# Patient Record
Sex: Male | Born: 1950
Health system: Southern US, Community
[De-identification: ages and names within clinical notes are randomized; demographics above are authoritative.]

## PROBLEM LIST (undated history)

## (undated) DIAGNOSIS — I10 Essential (primary) hypertension: Secondary | ICD-10-CM

## (undated) DIAGNOSIS — C801 Malignant (primary) neoplasm, unspecified: Secondary | ICD-10-CM

## (undated) DIAGNOSIS — Z720 Tobacco use: Secondary | ICD-10-CM

## (undated) DIAGNOSIS — K2970 Gastritis, unspecified, without bleeding: Secondary | ICD-10-CM

## (undated) DIAGNOSIS — C61 Malignant neoplasm of prostate: Secondary | ICD-10-CM

## (undated) DIAGNOSIS — R3915 Urgency of urination: Secondary | ICD-10-CM

## (undated) DIAGNOSIS — B9681 Helicobacter pylori [H. pylori] as the cause of diseases classified elsewhere: Secondary | ICD-10-CM

## (undated) DIAGNOSIS — R972 Elevated prostate specific antigen [PSA]: Secondary | ICD-10-CM

## (undated) HISTORY — DX: Essential (primary) hypertension: I10

## (undated) HISTORY — DX: Urgency of urination: R39.15

## (undated) HISTORY — PX: TONSILLECTOMY: SUR1361

## (undated) HISTORY — DX: Helicobacter pylori (H. pylori) as the cause of diseases classified elsewhere: B96.81

## (undated) HISTORY — DX: Malignant (primary) neoplasm, unspecified: C80.1

## (undated) HISTORY — DX: Gastritis, unspecified, without bleeding: K29.70

## (undated) HISTORY — DX: Tobacco use: Z72.0

## (undated) HISTORY — DX: Malignant neoplasm of prostate: C61

## (undated) HISTORY — DX: Elevated prostate specific antigen (PSA): R97.20

---

## 2006-08-17 ENCOUNTER — Other Ambulatory Visit: Payer: Self-pay

## 2006-08-17 ENCOUNTER — Inpatient Hospital Stay: Payer: Self-pay | Admitting: Internal Medicine

## 2006-08-23 ENCOUNTER — Ambulatory Visit: Payer: Self-pay | Admitting: Unknown Physician Specialty

## 2006-11-01 ENCOUNTER — Ambulatory Visit: Payer: Self-pay | Admitting: Unknown Physician Specialty

## 2007-05-03 ENCOUNTER — Emergency Department: Payer: Self-pay

## 2011-02-04 ENCOUNTER — Emergency Department: Payer: Self-pay | Admitting: *Deleted

## 2011-03-05 ENCOUNTER — Ambulatory Visit: Payer: Self-pay | Admitting: Urology

## 2011-03-09 ENCOUNTER — Ambulatory Visit: Payer: Self-pay | Admitting: Urology

## 2011-09-20 ENCOUNTER — Ambulatory Visit: Payer: Self-pay | Admitting: Urology

## 2011-09-22 HISTORY — PX: GREEN LIGHT LASER TURP (TRANSURETHRAL RESECTION OF PROSTATE: SHX6260

## 2011-09-24 ENCOUNTER — Ambulatory Visit: Payer: Self-pay | Admitting: Urology

## 2014-08-15 NOTE — H&P (Signed)
PATIENT NAME:  Mario Hernandez, Mario Hernandez MR#:  [id number] DATE OF BIRTH:  05/01/1950  DATE OF ADMISSION:  09/24/2011  CHIEF COMPLAINT: Difficulty voiding.   HISTORY OF PRESENT ILLNESS: Mr. Stickler is a 64 year old Caucasian male with a long history of lower urinary tract symptoms. He was also found to have an elevated PSA of 23.62 and underwent subsequent ultrasound-guided biopsy of the prostate revealing a Gleason's grade 4 + 3 adenocarcinoma involving both sides of his prostate. Staging studies included abdominal pelvic CT scan with and without contrast, and bone scan, and did not reveal any metastatic disease. Because of his significant lower urinary tract symptoms, he has been on Avodart, DES, aspirin, and Casodex in an attempt to downsize his prostate. He does not wish to have radical prostatectomy or external beam radiation therapy, but rather has opted for brachytherapy. He comes in now for photovaporization of the prostate with a green light laser because of persistent urinary symptoms and intravesical growth of median lobe with obstruction.   ALLERGIES: Penicillin.   CURRENT MEDICATIONS: Benicar and Avodart.   PAST SURGICAL HISTORY:  1. Tonsillectomy as a child. 2. Repair of injury to one of his distal left fingers.   SOCIAL HISTORY: He smokes a pack a day and has greater than 40 pack-year history. He consumes 1 to 4 alcoholic beverages per week.   FAMILY HISTORY: Remarkable for father with heart disease and hypertension and kidney stones.   PAST AND CURRENT MEDICAL CONDITIONS:  1. Hypertension.  2. Seasonal allergies.   REVIEW OF SYSTEMS: The patient denies chest pain, shortness of breath, diabetes, or stroke.   PHYSICAL EXAMINATION:   GENERAL: Well-nourished white male in no acute distress.   HEENT: Sclerae were clear. Pupils were equally round and reactive to light and accommodation. Extraocular movements were intact.   NECK: Supple. No palpable cervical adenopathy. No audible carotid  bruits.   LUNGS: Clear to auscultation.   CARDIOVASCULAR: Regular rhythm and rate without audible murmurs.   ABDOMEN: Soft and nontender abdomen.   GENITOURINARY: Circumcised with atrophic testes.   NEUROMUSCULAR: Nonfocal.   RECTAL: 25 gram smooth, nontender prostate.   IMPRESSION:  1. Bladder outlet obstruction secondary to benign prostatic hypertrophy. 2. Prostate cancer.   PLAN: Photovaporization of the prostate with green light laser. ____________________________ Otelia Limes. Yves Dill, MD [email or web address] D: 09/20/2011 10:18:38 ET     T: 09/20/2011 10:53:15 ET        JOB#: [id number] cc: Otelia Limes. Yves Dill, MD, <Dictator> Royston Cowper MD ELECTRONICALLY SIGNED 09/24/2011 7:40

## 2014-08-15 NOTE — Op Note (Signed)
PATIENT NAME:  Mario Hernandez, Mario Hernandez MR#:  [id number] DATE OF BIRTH:  12-08-1950  DATE OF PROCEDURE:  09/24/2011  PREOPERATIVE DIAGNOSES:  1. Bladder outlet obstruction.  2. Prostate cancer.   POSTOPERATIVE DIAGNOSES:  1. Bladder outlet obstruction.  2. Prostate cancer.   PROCEDURE: Photovaporization of the prostate with green light laser.   SURGEON: Otelia Limes. Yves Dill, MD   ANESTHETIST: Dr. [place] Service   ANESTHETIC METHOD: General.   INDICATIONS: See the dictated history and physical. After informed consent, the patient requests the above procedure.   OPERATIVE SUMMARY: After adequate general anesthesia had been obtained, the patient was placed into dorsal lithotomy position and the perineum was prepped and draped in the usual fashion. The laser scope was coupled with the camera and then visually advanced into the bladder. The patient had trilobar BPH with obstructing lateral lobes and median lobe. Bladder was heavily trabeculated. No bladder tumors were identified. Both ureteral orifices were identified and had clear efflux. At this point the green light laser XPS fiber was introduced through the scope and vaporization of the prostate was begun at the bladder neck at setting of 80 watts. Power was then increased to 120 watts and remaining obstructive tissue was vaporized to the level of the verumontanum. At this point the scope was removed and a 20 [place] silicone catheter was placed. Catheter was irrigated until clear. A B and O suppository was placed.      The procedure was then terminated and the patient was transferred to the recovery room in stable condition.   ____________________________ Otelia Limes. Yves Dill, MD [email or web address] D: 09/24/2011 13:26:36 ET T: 09/24/2011 13:31:56 ET JOB#: [id number]  cc: Otelia Limes. Yves Dill, MD, <Dictator> Royston Cowper MD ELECTRONICALLY SIGNED 09/27/2011 12:30

## 2015-05-06 DIAGNOSIS — Z0000 Encounter for general adult medical examination without abnormal findings: Secondary | ICD-10-CM | POA: Diagnosis not present

## 2015-05-06 DIAGNOSIS — Z125 Encounter for screening for malignant neoplasm of prostate: Secondary | ICD-10-CM | POA: Diagnosis not present

## 2015-05-10 ENCOUNTER — Encounter: Payer: Self-pay | Admitting: Urology

## 2015-05-10 ENCOUNTER — Ambulatory Visit (INDEPENDENT_AMBULATORY_CARE_PROVIDER_SITE_OTHER): Payer: PPO | Admitting: Urology

## 2015-05-10 VITALS — BP 133/87 | HR 75 | Ht 68.0 in | Wt 146.9 lb

## 2015-05-10 DIAGNOSIS — R972 Elevated prostate specific antigen [PSA]: Secondary | ICD-10-CM | POA: Diagnosis not present

## 2015-05-10 DIAGNOSIS — Z87891 Personal history of nicotine dependence: Secondary | ICD-10-CM | POA: Insufficient documentation

## 2015-05-10 DIAGNOSIS — C61 Malignant neoplasm of prostate: Secondary | ICD-10-CM

## 2015-05-10 DIAGNOSIS — I10 Essential (primary) hypertension: Secondary | ICD-10-CM | POA: Insufficient documentation

## 2015-05-10 DIAGNOSIS — C801 Malignant (primary) neoplasm, unspecified: Secondary | ICD-10-CM | POA: Insufficient documentation

## 2015-05-10 DIAGNOSIS — Z720 Tobacco use: Secondary | ICD-10-CM

## 2015-05-10 HISTORY — DX: Tobacco use: Z72.0

## 2015-05-10 HISTORY — DX: Malignant (primary) neoplasm, unspecified: C80.1

## 2015-05-10 HISTORY — DX: Essential (primary) hypertension: I10

## 2015-05-10 LAB — URINALYSIS, COMPLETE
BILIRUBIN UA: NEGATIVE
Glucose, UA: NEGATIVE
KETONES UA: NEGATIVE
Nitrite, UA: NEGATIVE
PH UA: 6 (ref 5.0–7.5)
PROTEIN UA: NEGATIVE
RBC UA: NEGATIVE
SPEC GRAV UA: 1.025 (ref 1.005–1.030)
UUROB: 0.2 mg/dL (ref 0.2–1.0)

## 2015-05-10 LAB — MICROSCOPIC EXAMINATION: RBC MICROSCOPIC, UA: NONE SEEN /HPF (ref 0–?)

## 2015-05-10 NOTE — Progress Notes (Signed)
05/10/2015 10:42 AM   Mario Hernandez 01-12-1951 [id number]  Referring provider: No referring provider defined for this encounter.  Chief Complaint  Patient presents with  . Prostate Cancer    rising PSA    HPI: Mr Mario Hernandez is a 65yo seen in consultation today for elevated PSA. His PSA on referral is 12.3. He has a hx of greenlight in 2013 by Dr. Rogers Hernandez. He has a known hx of prostate cancer. He had a PSA of 23.6 and on biopsy was found to have Gleason 4+3=7 prostate cancer. After PVP his PSA was 0.1 and was on firmagon. He has not followed up since 2015. He was due to for ADT in 06/2014.  His last bone and CT scan was several years. He has new right shoulder pain for the past 3 months.    He has mild urgency and occasional urge incontinence.  No hematuria and dysuria.     PMH: Past Medical History  Diagnosis Date  . Prostate cancer (Mario Hernandez)   . Helicobacter pylori gastritis   . Hypertension   . Elevated PSA   . Urinary urgency     Surgical History: Past Surgical History  Procedure Laterality Date  . Tonsillectomy    . Green light laser turp (transurethral resection of prostate  09/2011    Home Medications:    Medication List       This list is accurate as of: 05/10/15 10:42 AM.  Always use your most recent med list.               CIALIS 5 MG tablet  Generic drug:  tadalafil  TAKE 1 TABLET BY MOUTH EVERY 3 DAYS AS DIRECTED.     losartan 25 MG tablet  Commonly known as:  COZAAR  take 1 tablet by mouth once daily        Allergies:  Allergies  Allergen Reactions  . Penicillins     Other reaction(s): Unknown    Family History: Family History  Problem Relation Age of Onset  . Coronary artery disease Father   . Kidney Stones Brother   . Hypertension Brother   . Bladder Cancer Neg Hx   . Prostate cancer Neg Hx   . Kidney cancer Neg Hx     Social History:  reports that he has been smoking Cigarettes.  He has been smoking about 1.00 pack per day. He does  not have any smokeless tobacco history on file. He reports that he drinks alcohol. He reports that he uses illicit drugs.  ROS: UROLOGY Frequent Urination?: No Hard to postpone urination?: Yes Burning/pain with urination?: No Get up at night to urinate?: No Leakage of urine?: Yes Urine stream starts and stops?: No Trouble starting stream?: No Do you have to strain to urinate?: No Blood in urine?: No Urinary tract infection?: No Sexually transmitted disease?: No Injury to kidneys or bladder?: No Painful intercourse?: No Weak stream?: No Erection problems?: Yes Penile pain?: No  Gastrointestinal Nausea?: No Vomiting?: No Indigestion/heartburn?: No Diarrhea?: No Constipation?: No  Constitutional Fever: No Night sweats?: No Weight loss?: No Fatigue?: No  Skin Skin rash/lesions?: No Itching?: No  Eyes Blurred vision?: No Double vision?: No  Ears/Nose/Throat Sore throat?: No Sinus problems?: No  Hematologic/Lymphatic Swollen glands?: No Easy bruising?: No  Cardiovascular Leg swelling?: No Chest pain?: No  Respiratory Cough?: No Shortness of breath?: No  Endocrine Excessive thirst?: No  Musculoskeletal Back pain?: No Joint pain?: Yes  Neurological Headaches?: No Dizziness?: No  Psychologic  Depression?: No Anxiety?: No  Physical Exam: BP 133/87 mmHg  Pulse 75  Ht 5\' 8"  (1.727 m)  Wt 66.633 kg (146 lb 14.4 oz)  BMI 22.34 kg/m2  Constitutional:  Alert and oriented, No acute distress. HEENT: [place] AT, moist mucus membranes.  Trachea midline, no masses. Cardiovascular: No clubbing, cyanosis, or edema. Respiratory: Normal respiratory effort, no increased work of breathing. GI: Abdomen is soft, nontender, nondistended, no abdominal masses GU: No CVA tenderness. Prostate 30g, flat, no nodules, no induration Skin: No rashes, bruises or suspicious lesions. Lymph: No cervical or inguinal adenopathy. Neurologic: Grossly intact, no focal deficits,  moving all 4 extremities. Psychiatric: Normal mood and affect.  Laboratory Data: Lab Results  Component Value Date   HGB 13.7 09/20/2011    No results found for: CREATININE  No results found for: PSA  No results found for: TESTOSTERONE  No results found for: HGBA1C  Urinalysis No results found for: COLORURINE, APPEARANCEUR, LABSPEC, PHURINE, GLUCOSEU, HGBUR, BILIRUBINUR, KETONESUR, PROTEINUR, UROBILINOGEN, NITRITE, LEUKOCYTESUR  Pertinent Imaging: none  Assessment & Plan:    1. Elevated PSA -CT and bone scan  2. Prostate cancer (Mario Hernandez) -CT and bone scan   No Follow-up on file.  Mario Hernandez, [facility] [address] [place], [place] [address] [phone]

## 2015-05-13 DIAGNOSIS — C801 Malignant (primary) neoplasm, unspecified: Secondary | ICD-10-CM | POA: Diagnosis not present

## 2015-05-13 DIAGNOSIS — M25511 Pain in right shoulder: Secondary | ICD-10-CM | POA: Diagnosis not present

## 2015-05-13 DIAGNOSIS — F17200 Nicotine dependence, unspecified, uncomplicated: Secondary | ICD-10-CM | POA: Diagnosis not present

## 2015-05-13 DIAGNOSIS — I10 Essential (primary) hypertension: Secondary | ICD-10-CM | POA: Diagnosis not present

## 2015-05-23 ENCOUNTER — Encounter
Admission: RE | Admit: 2015-05-23 | Discharge: 2015-05-23 | Disposition: A | Discharge status: Home / Self Care | Payer: PPO | Source: Ambulatory Visit | Attending: Urology | Admitting: Urology

## 2015-05-23 ENCOUNTER — Ambulatory Visit
Admission: RE | Admit: 2015-05-23 | Discharge: 2015-05-23 | Disposition: A | Discharge status: Home / Self Care | Payer: PPO | Source: Ambulatory Visit | Attending: Urology | Admitting: Urology

## 2015-05-23 DIAGNOSIS — C61 Malignant neoplasm of prostate: Secondary | ICD-10-CM

## 2015-05-23 DIAGNOSIS — I2510 Atherosclerotic heart disease of native coronary artery without angina pectoris: Secondary | ICD-10-CM | POA: Insufficient documentation

## 2015-05-23 DIAGNOSIS — N329 Bladder disorder, unspecified: Secondary | ICD-10-CM | POA: Insufficient documentation

## 2015-05-23 DIAGNOSIS — R911 Solitary pulmonary nodule: Secondary | ICD-10-CM | POA: Insufficient documentation

## 2015-05-23 DIAGNOSIS — R972 Elevated prostate specific antigen [PSA]: Secondary | ICD-10-CM | POA: Diagnosis not present

## 2015-05-23 MED ORDER — TECHNETIUM TC 99M MEDRONATE IV KIT
22.6390 | PACK | Freq: Once | INTRAVENOUS | Status: AC | PRN
  Administered 2015-05-23: 22.639 via INTRAVENOUS

## 2015-05-23 MED ORDER — IOHEXOL 350 MG/ML SOLN
100.0000 mL | Freq: Once | INTRAVENOUS | Status: AC | PRN
  Administered 2015-05-23: 100 mL via INTRAVENOUS

## 2015-05-24 ENCOUNTER — Ambulatory Visit (INDEPENDENT_AMBULATORY_CARE_PROVIDER_SITE_OTHER): Payer: PPO | Admitting: Urology

## 2015-05-24 ENCOUNTER — Encounter: Payer: Self-pay | Admitting: Urology

## 2015-05-24 VITALS — BP 123/83 | HR 66 | Ht 68.5 in | Wt 145.2 lb

## 2015-05-24 DIAGNOSIS — C61 Malignant neoplasm of prostate: Secondary | ICD-10-CM | POA: Diagnosis not present

## 2015-05-24 NOTE — Progress Notes (Signed)
2:35 PM   Mario Hernandez 04-Apr-1951 [id number]  Referring provider: Tracie Harrier, MD [address] [facility] [place], [place] [address]  Chief Complaint  Patient presents with  . Follow-up    Prostate Cancer, CT & Bone Scan    HPI:  1 - High Risk Prostate Cancer - very unusual history with initial diagnosis 2013 and underwent greenlight PVP (NOT a cancer surgery) + short term androgen deprivation for Gleason 4+3=7 disease and PSA 23.6.  Recent Course: 04/2015 - PSA 12.3, CT and Bone Scan w/o metastatic disease  2 - Lower Urinary Tract Symptoms - s/p PVP 2013. No obstructive symptoms. Occasional bother from urge.   Today " Mario Hernandez " is seen in f/u above. Interval CT and bone scan favorable without evidence of distant disease, therefore still has clinically localized high-risk disease.   PMH: Past Medical History  Diagnosis Date  . Prostate cancer ([place])   . Helicobacter pylori gastritis   . Hypertension   . Elevated PSA   . Urinary urgency     Surgical History: Past Surgical History  Procedure Laterality Date  . Tonsillectomy    . Green light laser turp (transurethral resection of prostate  09/2011    Home Medications:    Medication List       This list is accurate as of: 05/24/15  2:35 PM.  Always use your most recent med list.               CIALIS 5 MG tablet  Generic drug:  tadalafil  TAKE 1 TABLET BY MOUTH EVERY 3 DAYS AS DIRECTED.     losartan 25 MG tablet  Commonly known as:  COZAAR  take 1 tablet by mouth once daily        Allergies:  Allergies  Allergen Reactions  . Penicillins     Other reaction(s): Unknown    Family History: Family History  Problem Relation Age of Onset  . Coronary artery disease Father   . Kidney Stones Brother   . Hypertension Brother   . Bladder Cancer Neg Hx   . Prostate cancer Neg Hx   . Kidney cancer Neg Hx     Social History:  reports that he has been smoking Cigarettes.  He has been  smoking about 1.00 pack per day. He does not have any smokeless tobacco history on file. He reports that he drinks alcohol. He reports that he uses illicit drugs.    Review of Systems  Gastrointestinal (upper)  : Negative for upper GI symptoms  Gastrointestinal (lower) : Negative for lower GI symptoms  Constitutional : Negative for symptoms  Skin: Negative for skin symptoms  Eyes: Negative for eye symptoms  Ear/Nose/Throat : Negative for Ear/Nose/Throat symptoms  Hematologic/Lymphatic: Negative for Hematologic/Lymphatic symptoms  Cardiovascular : Negative for cardiovascular symptoms  Respiratory : Negative for respiratory symptoms  Endocrine: Negative for endocrine symptoms  Musculoskeletal: Negative for musculoskeletal symptoms  Neurological: Negative for neurological symptoms  Psychologic: Negative for psychiatric symptoms     Physical Exam: There were no vitals taken for this visit.  Constitutional:  Alert and oriented, No acute distress. HEENT: [place] AT, moist mucus membranes.  Trachea midline, no masses. Cardiovascular: No clubbing, cyanosis, or edema. Respiratory: Normal respiratory effort, no increased work of breathing. GI: Abdomen is soft, nontender, nondistended, no abdominal masses GU: No CVA tenderness. Prostate 30g, flat, no nodules, no induration Skin: No rashes, bruises or suspicious lesions. Lymph: No cervical or inguinal adenopathy. Neurologic: Grossly  intact, no focal deficits, moving all 4 extremities. Psychiatric: Normal mood and affect.  Laboratory Data: Lab Results  Component Value Date   HGB 13.7 09/20/2011    No results found for: CREATININE  No results found for: PSA  No results found for: TESTOSTERONE  No results found for: HGBA1C  Urinalysis    Component Value Date/Time   GLUCOSEU Negative 05/10/2015 1030   BILIRUBINUR Negative 05/10/2015 1030   NITRITE Negative 05/10/2015 1030   LEUKOCYTESUR 1+* 05/10/2015 1030      Pertinent Imaging: none  Assessment & Plan:    1 - High Risk Prostate Cancer - disease remains clinically localized. Discussed options of curative-intent treatment (XRT, likely with 2 years ADT) v. Surgery (not favored given h/o piror outlet procedure as much worse incontinence likely), v. Non-curative intent management. We discussed the natural history of his disease with likely local and then possibly eventual distant progression over years-decades w/o therapy.   He opts for radiation oncology referral and I agree.   2 - Lower Urinary Tract Symptoms - presently minimal bother, discussed likely transient worsening of irritative symptoms with XRT.    No Follow-up on file.  Alexis Frock, [facility] [address], [place], [place] [address] [phone]

## 2015-05-30 ENCOUNTER — Institutional Professional Consult (permissible substitution): Payer: PPO | Admitting: Radiation Oncology

## 2015-06-01 ENCOUNTER — Encounter: Payer: Self-pay | Admitting: Radiation Oncology

## 2015-06-01 ENCOUNTER — Ambulatory Visit
Admission: RE | Admit: 2015-06-01 | Discharge: 2015-06-01 | Disposition: A | Discharge status: Home / Self Care | Payer: PPO | Source: Ambulatory Visit | Attending: Radiation Oncology | Admitting: Radiation Oncology

## 2015-06-01 VITALS — BP 130/80 | HR 70 | Temp 97.6°F | Wt 145.9 lb

## 2015-06-01 DIAGNOSIS — C61 Malignant neoplasm of prostate: Secondary | ICD-10-CM | POA: Diagnosis not present

## 2015-06-01 DIAGNOSIS — Z510 Encounter for antineoplastic radiation therapy: Secondary | ICD-10-CM | POA: Insufficient documentation

## 2015-06-01 NOTE — Consult Note (Signed)
Except an outstanding is perfect of Radiation Oncology NEW PATIENT EVALUATION  Name: Mario Hernandez  MRN: [id number]  Date:   06/01/2015     DOB: [birth year]   This 65 y.o. male patient presents to the clinic for initial evaluation of stage II adenocarcinoma the prostate Gleason score of 7 (4+3) presenting now with a PSA of 12.3 status post greenlight therapy in 2013 now with progressive PSA.  REFERRING PHYSICIAN: Tracie Harrier, MD  CHIEF COMPLAINT:  Chief Complaint  Patient presents with  . Prostate Cancer    Initial evaluation for radiation treatment    DIAGNOSIS: The encounter diagnosis was Malignant neoplasm of prostate ([place]).   PREVIOUS INVESTIGATIONS:  Bone scan and PET/CT scan reviewed Pathology report reviewed Clinical notes reviewed  HPI: Patient is a 65 year old male diagnosed with adenocarcinoma of the prostate back in 2013 status post greenlight therapy and androgen to deprivation therapy at that time by local urologist. His initial PSA was 23.6 with biopsy positive adenocarcinoma Gleason 7 (4+3). Patient was put on firma gone and after PVP his PSA dropped 0.1. He had been on continuous androgen deprivation therapy although discontinued and March 2016 although his PSA climbed to 12.3. Repeat bone scans  CT scan both were negative for metastatic disease. CT scans showed no evidence of pelvic lymphadenopathy. Based on the fact tumor remains localize clinically curative options would be radiation therapy versus surgery not favored giving prior history of out let obstruction from his prior greenlight therapy. He states his urgency and frequency are fairly stable. He does have erectile dysfunction is recently on cialis he is seen today for radiation oncology opinion.Marland Kitchen  PLANNED TREATMENT REGIMEN: I MRT radiation therapy  PAST MEDICAL HISTORY:  has a past medical history of Prostate cancer ([place]); Helicobacter pylori gastritis; Hypertension; Elevated PSA; Urinary urgency; BP (high  blood pressure) (05/10/2015); Current tobacco use (05/10/2015); and Malignant neoplastic disease ([place]) (05/10/2015).    PAST SURGICAL HISTORY:  Past Surgical History  Procedure Laterality Date  . Tonsillectomy    . Green light laser turp (transurethral resection of prostate  09/2011    FAMILY HISTORY: family history includes Coronary artery disease in his father; Hypertension in his brother; Kidney Stones in his brother. There is no history of Bladder Cancer, Prostate cancer, or Kidney cancer.  SOCIAL HISTORY:  reports that he has been smoking Cigarettes.  He has been smoking about 1.00 pack per day. He does not have any smokeless tobacco history on file. He reports that he drinks alcohol. He reports that he uses illicit drugs.  ALLERGIES: Penicillins  MEDICATIONS:  Current Outpatient Prescriptions  Medication Sig Dispense Refill  . losartan (COZAAR) 25 MG tablet take 1 tablet by mouth once daily    . meloxicam (MOBIC) 7.5 MG tablet Take by mouth.    . tadalafil (CIALIS) 5 MG tablet TAKE 1 TABLET BY MOUTH EVERY 3 DAYS AS DIRECTED.     No current facility-administered medications for this encounter.    ECOG PERFORMANCE STATUS:  0 - Asymptomatic  REVIEW OF SYSTEMS: Except for urgency frequency of urination nocturia 2-3 Patient denies any weight loss, fatigue, weakness, fever, chills or night sweats. Patient denies any loss of vision, blurred vision. Patient denies any ringing  of the ears or hearing loss. No irregular heartbeat. Patient denies heart murmur or history of fainting. Patient denies any chest pain or pain radiating to her upper extremities. Patient denies any shortness of breath, difficulty breathing at night, cough or hemoptysis. Patient denies any swelling  in the lower legs. Patient denies any nausea vomiting, vomiting of blood, or coffee ground material in the vomitus. Patient denies any stomach pain. Patient states has had normal bowel movements no significant constipation or  diarrhea. Patient denies any dysuria, hematuria or significant nocturia. Patient denies any problems walking, swelling in the joints or loss of balance. Patient denies any skin changes, loss of hair or loss of weight. Patient denies any excessive worrying or anxiety or significant depression. Patient denies any problems with insomnia. Patient denies excessive thirst, polyuria, polydipsia. Patient denies any swollen glands, patient denies easy bruising or easy bleeding. Patient denies any recent infections, allergies or URI. Patient "s visual fields have not changed significantly in recent time.    PHYSICAL EXAM: BP 130/80 mmHg  Pulse 70  Temp(Src) 97.6 F (36.4 C)  Wt 145 lb 15.1 oz (66.2 kg) On rectal exam rectal sphincter tone is good prostate is shrunk no evidence of mass or nodularity is noted sulcus is indistinct bilaterally. No other rectal abnormality is identified. Well-developed well-nourished patient in NAD. HEENT reveals PERLA, EOMI, discs not visualized.  Oral cavity is clear. No oral mucosal lesions are identified. Neck is clear without evidence of cervical or supraclavicular adenopathy. Lungs are clear to A&P. Cardiac examination is essentially unremarkable with regular rate and rhythm without murmur rub or thrill. Abdomen is benign with no organomegaly or masses noted. Motor sensory and DTR levels are equal and symmetric in the upper and lower extremities. Cranial nerves II through XII are grossly intact. Proprioception is intact. No peripheral adenopathy or edema is identified. No motor or sensory levels are noted. Crude visual fields are within normal range.  LABORATORY DATA: Pathology reports requested for my review    RADIOLOGY RESULTS: CT scan and bone scan reviewed compatible with the above-stated findings   IMPRESSION: High risk prostate cancer Gleason 7 (4+3) with rising PSA in 65 year old male status post greenlight therapy for years prior.  PLAN: At this time I to go  ahead with curative radiation therapy. I would prefer I am RT treatment planning and delivery so I can treat his pelvic lymph nodes as well as his prostate. By the [facility] nomogram he has close to 20% chance of lymph node involvement at this time and an 80% chance of extracapsular extension. I would treat his prostate 8000 cGy and his pelvic lymph nodes to 5400 cGy using I am RT dose painting technique. Risks and benefits of treatment including increased lower urinary tract symptoms, fatigue, worsening of erectile dysfunction diarrhea all were discussed in detail with the patient. I have asked urology to place gold fiduciary markers for daily image guided treatment. I have set up and ordered CT simulation on the patient shortly thereafter. Patient also benefited from Lupron therapy again for the next 2 years although the patient based on his previous symptoms and tolerance of firm gone is reluctant to undergo that therapy. We will discuss that further at our next meeting.  I would like to take this opportunity for allowing me to participate in the care of your patient.Armstead Peaks., MD

## 2015-06-16 ENCOUNTER — Ambulatory Visit (INDEPENDENT_AMBULATORY_CARE_PROVIDER_SITE_OTHER): Payer: PPO | Admitting: Urology

## 2015-06-16 ENCOUNTER — Encounter: Payer: Self-pay | Admitting: Urology

## 2015-06-16 VITALS — BP 149/85 | HR 73 | Ht 68.0 in | Wt 146.0 lb

## 2015-06-16 DIAGNOSIS — C61 Malignant neoplasm of prostate: Secondary | ICD-10-CM | POA: Diagnosis not present

## 2015-06-16 MED ORDER — GENTAMICIN SULFATE 40 MG/ML IJ SOLN
80.0000 mg | Freq: Once | INTRAMUSCULAR | Status: AC
  Administered 2015-06-16: 80 mg via INTRAMUSCULAR

## 2015-06-16 MED ORDER — LEVOFLOXACIN 500 MG PO TABS
500.0000 mg | ORAL_TABLET | Freq: Once | ORAL | Status: AC
Start: 2015-06-16 — End: 2015-06-16
  Administered 2015-06-16: 500 mg via ORAL

## 2015-06-16 NOTE — Progress Notes (Signed)
06/16/2015   CC: prostate cancer   HPI: 65 year old male with Gleason 4+3 prostate cancer, PSA 23 undergoing external beam radiation and possible ADT with Dr. Berton Mount. I was asked to place fiducial gold seed markers today.  Prostate Gold Seed Marker Placement Procedure   Informed consent was obtained after discussing risks/benefits of the procedure.  A time out was performed to ensure correct patient identity.  Pre-Procedure: - Gentamicin given prophylactically - PO levaquin given  Procedure: - Lidojet applied per rectum, transrectal ultrasound probe placed -Visualization of the prostate revealed significant final agreement laser TURP defect, relatively small prostate - 3 fiducial gold seed markers placed, one at right base, one at left base, one at apex of prostate gland under transrectal ultrasound guidance  Post-Procedure: - Patient tolerated the procedure well - He was counseled to seek immediate medical attention if experiences any severe pain, significant bleeding, or fevers  Plan: F/u in 6 months to discuss voiding symptoms, f/u prostate cancer

## 2015-06-21 ENCOUNTER — Ambulatory Visit
Admission: RE | Admit: 2015-06-21 | Discharge: 2015-06-21 | Disposition: A | Discharge status: Home / Self Care | Payer: PPO | Source: Ambulatory Visit | Attending: Radiation Oncology | Admitting: Radiation Oncology

## 2015-06-21 DIAGNOSIS — Z510 Encounter for antineoplastic radiation therapy: Secondary | ICD-10-CM | POA: Diagnosis not present

## 2015-06-21 DIAGNOSIS — C61 Malignant neoplasm of prostate: Secondary | ICD-10-CM | POA: Diagnosis not present

## 2015-06-23 ENCOUNTER — Other Ambulatory Visit: Payer: Self-pay | Admitting: *Deleted

## 2015-06-23 DIAGNOSIS — C61 Malignant neoplasm of prostate: Secondary | ICD-10-CM

## 2015-06-24 DIAGNOSIS — C61 Malignant neoplasm of prostate: Secondary | ICD-10-CM | POA: Diagnosis not present

## 2015-06-24 DIAGNOSIS — Z510 Encounter for antineoplastic radiation therapy: Secondary | ICD-10-CM | POA: Diagnosis not present

## 2015-06-30 ENCOUNTER — Ambulatory Visit
Admission: RE | Admit: 2015-06-30 | Discharge: 2015-06-30 | Disposition: A | Discharge status: Home / Self Care | Payer: PPO | Source: Ambulatory Visit | Attending: Radiation Oncology | Admitting: Radiation Oncology

## 2015-06-30 DIAGNOSIS — Z510 Encounter for antineoplastic radiation therapy: Secondary | ICD-10-CM | POA: Diagnosis not present

## 2015-06-30 DIAGNOSIS — C61 Malignant neoplasm of prostate: Secondary | ICD-10-CM | POA: Diagnosis not present

## 2015-07-04 ENCOUNTER — Ambulatory Visit
Admission: RE | Admit: 2015-07-04 | Discharge: 2015-07-04 | Disposition: A | Discharge status: Home / Self Care | Payer: PPO | Source: Ambulatory Visit | Attending: Radiation Oncology | Admitting: Radiation Oncology

## 2015-07-04 DIAGNOSIS — Z510 Encounter for antineoplastic radiation therapy: Secondary | ICD-10-CM | POA: Diagnosis not present

## 2015-07-04 DIAGNOSIS — C61 Malignant neoplasm of prostate: Secondary | ICD-10-CM | POA: Diagnosis not present

## 2015-07-05 ENCOUNTER — Ambulatory Visit
Admission: RE | Admit: 2015-07-05 | Discharge: 2015-07-05 | Disposition: A | Discharge status: Home / Self Care | Payer: PPO | Source: Ambulatory Visit | Attending: Radiation Oncology | Admitting: Radiation Oncology

## 2015-07-05 DIAGNOSIS — C61 Malignant neoplasm of prostate: Secondary | ICD-10-CM | POA: Diagnosis not present

## 2015-07-05 DIAGNOSIS — Z510 Encounter for antineoplastic radiation therapy: Secondary | ICD-10-CM | POA: Diagnosis not present

## 2015-07-06 ENCOUNTER — Ambulatory Visit
Admission: RE | Admit: 2015-07-06 | Discharge: 2015-07-06 | Disposition: A | Discharge status: Home / Self Care | Payer: PPO | Source: Ambulatory Visit | Attending: Radiation Oncology | Admitting: Radiation Oncology

## 2015-07-06 DIAGNOSIS — Z510 Encounter for antineoplastic radiation therapy: Secondary | ICD-10-CM | POA: Diagnosis not present

## 2015-07-06 DIAGNOSIS — C61 Malignant neoplasm of prostate: Secondary | ICD-10-CM | POA: Diagnosis not present

## 2015-07-07 ENCOUNTER — Ambulatory Visit
Admission: RE | Admit: 2015-07-07 | Discharge: 2015-07-07 | Disposition: A | Discharge status: Home / Self Care | Payer: PPO | Source: Ambulatory Visit | Attending: Radiation Oncology | Admitting: Radiation Oncology

## 2015-07-07 DIAGNOSIS — C61 Malignant neoplasm of prostate: Secondary | ICD-10-CM | POA: Diagnosis not present

## 2015-07-07 DIAGNOSIS — Z510 Encounter for antineoplastic radiation therapy: Secondary | ICD-10-CM | POA: Diagnosis not present

## 2015-07-08 ENCOUNTER — Ambulatory Visit
Admission: RE | Admit: 2015-07-08 | Discharge: 2015-07-08 | Disposition: A | Discharge status: Home / Self Care | Payer: PPO | Source: Ambulatory Visit | Attending: Radiation Oncology | Admitting: Radiation Oncology

## 2015-07-08 DIAGNOSIS — Z510 Encounter for antineoplastic radiation therapy: Secondary | ICD-10-CM | POA: Diagnosis not present

## 2015-07-08 DIAGNOSIS — C61 Malignant neoplasm of prostate: Secondary | ICD-10-CM | POA: Diagnosis not present

## 2015-07-11 ENCOUNTER — Ambulatory Visit
Admission: RE | Admit: 2015-07-11 | Discharge: 2015-07-11 | Disposition: A | Discharge status: Home / Self Care | Payer: PPO | Source: Ambulatory Visit | Attending: Radiation Oncology | Admitting: Radiation Oncology

## 2015-07-11 ENCOUNTER — Ambulatory Visit: Payer: PPO

## 2015-07-11 DIAGNOSIS — C61 Malignant neoplasm of prostate: Secondary | ICD-10-CM | POA: Diagnosis not present

## 2015-07-11 DIAGNOSIS — Z510 Encounter for antineoplastic radiation therapy: Secondary | ICD-10-CM | POA: Diagnosis not present

## 2015-07-12 ENCOUNTER — Ambulatory Visit
Admission: RE | Admit: 2015-07-12 | Discharge: 2015-07-12 | Disposition: A | Discharge status: Home / Self Care | Payer: PPO | Source: Ambulatory Visit | Attending: Radiation Oncology | Admitting: Radiation Oncology

## 2015-07-12 DIAGNOSIS — C61 Malignant neoplasm of prostate: Secondary | ICD-10-CM | POA: Diagnosis not present

## 2015-07-12 DIAGNOSIS — Z510 Encounter for antineoplastic radiation therapy: Secondary | ICD-10-CM | POA: Diagnosis not present

## 2015-07-13 ENCOUNTER — Ambulatory Visit
Admission: RE | Admit: 2015-07-13 | Discharge: 2015-07-13 | Disposition: A | Discharge status: Home / Self Care | Payer: PPO | Source: Ambulatory Visit | Attending: Radiation Oncology | Admitting: Radiation Oncology

## 2015-07-13 DIAGNOSIS — C61 Malignant neoplasm of prostate: Secondary | ICD-10-CM | POA: Diagnosis not present

## 2015-07-13 DIAGNOSIS — Z510 Encounter for antineoplastic radiation therapy: Secondary | ICD-10-CM | POA: Diagnosis not present

## 2015-07-14 ENCOUNTER — Ambulatory Visit
Admission: RE | Admit: 2015-07-14 | Discharge: 2015-07-14 | Disposition: A | Discharge status: Home / Self Care | Payer: PPO | Source: Ambulatory Visit | Attending: Radiation Oncology | Admitting: Radiation Oncology

## 2015-07-14 DIAGNOSIS — Z510 Encounter for antineoplastic radiation therapy: Secondary | ICD-10-CM | POA: Diagnosis not present

## 2015-07-14 DIAGNOSIS — C61 Malignant neoplasm of prostate: Secondary | ICD-10-CM | POA: Diagnosis not present

## 2015-07-15 ENCOUNTER — Ambulatory Visit
Admission: RE | Admit: 2015-07-15 | Discharge: 2015-07-15 | Disposition: A | Discharge status: Home / Self Care | Payer: PPO | Source: Ambulatory Visit | Attending: Radiation Oncology | Admitting: Radiation Oncology

## 2015-07-15 DIAGNOSIS — Z510 Encounter for antineoplastic radiation therapy: Secondary | ICD-10-CM | POA: Diagnosis not present

## 2015-07-15 DIAGNOSIS — C61 Malignant neoplasm of prostate: Secondary | ICD-10-CM | POA: Diagnosis not present

## 2015-07-18 ENCOUNTER — Ambulatory Visit: Payer: PPO

## 2015-07-18 ENCOUNTER — Inpatient Hospital Stay: Payer: PPO | Attending: Radiation Oncology

## 2015-07-18 ENCOUNTER — Ambulatory Visit
Admission: RE | Admit: 2015-07-18 | Discharge: 2015-07-18 | Disposition: A | Discharge status: Home / Self Care | Payer: PPO | Source: Ambulatory Visit | Attending: Radiation Oncology | Admitting: Radiation Oncology

## 2015-07-18 DIAGNOSIS — C61 Malignant neoplasm of prostate: Secondary | ICD-10-CM | POA: Insufficient documentation

## 2015-07-19 ENCOUNTER — Other Ambulatory Visit: Payer: Self-pay | Admitting: *Deleted

## 2015-07-19 ENCOUNTER — Ambulatory Visit
Admission: RE | Admit: 2015-07-19 | Discharge: 2015-07-19 | Disposition: A | Discharge status: Home / Self Care | Payer: PPO | Source: Ambulatory Visit | Attending: Radiation Oncology | Admitting: Radiation Oncology

## 2015-07-19 ENCOUNTER — Inpatient Hospital Stay: Payer: PPO

## 2015-07-19 DIAGNOSIS — Z510 Encounter for antineoplastic radiation therapy: Secondary | ICD-10-CM | POA: Diagnosis not present

## 2015-07-19 DIAGNOSIS — C61 Malignant neoplasm of prostate: Secondary | ICD-10-CM

## 2015-07-19 LAB — CBC
HCT: 42.8 % (ref 40.0–52.0)
Hemoglobin: 14.6 g/dL (ref 13.0–18.0)
MCH: 31.5 pg (ref 26.0–34.0)
MCHC: 34.2 g/dL (ref 32.0–36.0)
MCV: 92.2 fL (ref 80.0–100.0)
PLATELETS: 142 10*3/uL — AB (ref 150–440)
RBC: 4.64 MIL/uL (ref 4.40–5.90)
RDW: 13.9 % (ref 11.5–14.5)
WBC: 4.3 10*3/uL (ref 3.8–10.6)

## 2015-07-19 MED ORDER — PHENAZOPYRIDINE HCL 200 MG PO TABS
200.0000 mg | ORAL_TABLET | Freq: Three times a day (TID) | ORAL | Status: DC | PRN
Start: 2015-07-19 — End: 2018-01-16

## 2015-07-20 ENCOUNTER — Ambulatory Visit
Admission: RE | Admit: 2015-07-20 | Discharge: 2015-07-20 | Disposition: A | Discharge status: Home / Self Care | Payer: PPO | Source: Ambulatory Visit | Attending: Radiation Oncology | Admitting: Radiation Oncology

## 2015-07-20 DIAGNOSIS — Z510 Encounter for antineoplastic radiation therapy: Secondary | ICD-10-CM | POA: Diagnosis not present

## 2015-07-20 DIAGNOSIS — C61 Malignant neoplasm of prostate: Secondary | ICD-10-CM | POA: Diagnosis not present

## 2015-07-21 ENCOUNTER — Ambulatory Visit
Admission: RE | Admit: 2015-07-21 | Discharge: 2015-07-21 | Disposition: A | Discharge status: Home / Self Care | Payer: PPO | Source: Ambulatory Visit | Attending: Radiation Oncology | Admitting: Radiation Oncology

## 2015-07-21 DIAGNOSIS — Z510 Encounter for antineoplastic radiation therapy: Secondary | ICD-10-CM | POA: Diagnosis not present

## 2015-07-21 DIAGNOSIS — C61 Malignant neoplasm of prostate: Secondary | ICD-10-CM | POA: Diagnosis not present

## 2015-07-22 ENCOUNTER — Ambulatory Visit
Admission: RE | Admit: 2015-07-22 | Discharge: 2015-07-22 | Disposition: A | Discharge status: Home / Self Care | Payer: PPO | Source: Ambulatory Visit | Attending: Radiation Oncology | Admitting: Radiation Oncology

## 2015-07-22 DIAGNOSIS — Z510 Encounter for antineoplastic radiation therapy: Secondary | ICD-10-CM | POA: Diagnosis not present

## 2015-07-22 DIAGNOSIS — C61 Malignant neoplasm of prostate: Secondary | ICD-10-CM | POA: Diagnosis not present

## 2015-07-25 ENCOUNTER — Ambulatory Visit
Admission: RE | Admit: 2015-07-25 | Discharge: 2015-07-25 | Disposition: A | Discharge status: Home / Self Care | Payer: PPO | Source: Ambulatory Visit | Attending: Radiation Oncology | Admitting: Radiation Oncology

## 2015-07-25 DIAGNOSIS — Z510 Encounter for antineoplastic radiation therapy: Secondary | ICD-10-CM | POA: Diagnosis not present

## 2015-07-25 DIAGNOSIS — C61 Malignant neoplasm of prostate: Secondary | ICD-10-CM | POA: Diagnosis not present

## 2015-07-26 ENCOUNTER — Ambulatory Visit
Admission: RE | Admit: 2015-07-26 | Discharge: 2015-07-26 | Disposition: A | Discharge status: Home / Self Care | Payer: PPO | Source: Ambulatory Visit | Attending: Radiation Oncology | Admitting: Radiation Oncology

## 2015-07-26 DIAGNOSIS — Z510 Encounter for antineoplastic radiation therapy: Secondary | ICD-10-CM | POA: Diagnosis not present

## 2015-07-26 DIAGNOSIS — C61 Malignant neoplasm of prostate: Secondary | ICD-10-CM | POA: Diagnosis not present

## 2015-07-27 ENCOUNTER — Ambulatory Visit
Admission: RE | Admit: 2015-07-27 | Discharge: 2015-07-27 | Disposition: A | Discharge status: Home / Self Care | Payer: PPO | Source: Ambulatory Visit | Attending: Radiation Oncology | Admitting: Radiation Oncology

## 2015-07-27 DIAGNOSIS — C61 Malignant neoplasm of prostate: Secondary | ICD-10-CM | POA: Diagnosis not present

## 2015-07-27 DIAGNOSIS — Z510 Encounter for antineoplastic radiation therapy: Secondary | ICD-10-CM | POA: Diagnosis not present

## 2015-07-28 ENCOUNTER — Ambulatory Visit
Admission: RE | Admit: 2015-07-28 | Discharge: 2015-07-28 | Disposition: A | Discharge status: Home / Self Care | Payer: PPO | Source: Ambulatory Visit | Attending: Radiation Oncology | Admitting: Radiation Oncology

## 2015-07-28 DIAGNOSIS — Z510 Encounter for antineoplastic radiation therapy: Secondary | ICD-10-CM | POA: Diagnosis not present

## 2015-07-28 DIAGNOSIS — C61 Malignant neoplasm of prostate: Secondary | ICD-10-CM | POA: Diagnosis not present

## 2015-07-29 ENCOUNTER — Ambulatory Visit
Admission: RE | Admit: 2015-07-29 | Discharge: 2015-07-29 | Disposition: A | Discharge status: Home / Self Care | Payer: PPO | Source: Ambulatory Visit | Attending: Radiation Oncology | Admitting: Radiation Oncology

## 2015-07-29 DIAGNOSIS — Z510 Encounter for antineoplastic radiation therapy: Secondary | ICD-10-CM | POA: Diagnosis not present

## 2015-07-29 DIAGNOSIS — C61 Malignant neoplasm of prostate: Secondary | ICD-10-CM | POA: Diagnosis not present

## 2015-08-01 ENCOUNTER — Inpatient Hospital Stay: Payer: PPO | Attending: Radiation Oncology

## 2015-08-01 ENCOUNTER — Ambulatory Visit
Admission: RE | Admit: 2015-08-01 | Discharge: 2015-08-01 | Disposition: A | Discharge status: Home / Self Care | Payer: PPO | Source: Ambulatory Visit | Attending: Radiation Oncology | Admitting: Radiation Oncology

## 2015-08-01 DIAGNOSIS — C61 Malignant neoplasm of prostate: Secondary | ICD-10-CM | POA: Diagnosis not present

## 2015-08-01 DIAGNOSIS — Z510 Encounter for antineoplastic radiation therapy: Secondary | ICD-10-CM | POA: Diagnosis not present

## 2015-08-01 LAB — CBC
HEMATOCRIT: 43.5 % (ref 40.0–52.0)
HEMOGLOBIN: 15.1 g/dL (ref 13.0–18.0)
MCH: 32.1 pg (ref 26.0–34.0)
MCHC: 34.7 g/dL (ref 32.0–36.0)
MCV: 92.3 fL (ref 80.0–100.0)
PLATELETS: 135 10*3/uL — AB (ref 150–440)
RBC: 4.71 MIL/uL (ref 4.40–5.90)
RDW: 14.3 % (ref 11.5–14.5)
WBC: 4.4 10*3/uL (ref 3.8–10.6)

## 2015-08-02 ENCOUNTER — Ambulatory Visit
Admission: RE | Admit: 2015-08-02 | Discharge: 2015-08-02 | Disposition: A | Discharge status: Home / Self Care | Payer: PPO | Source: Ambulatory Visit | Attending: Radiation Oncology | Admitting: Radiation Oncology

## 2015-08-02 DIAGNOSIS — C61 Malignant neoplasm of prostate: Secondary | ICD-10-CM | POA: Diagnosis not present

## 2015-08-02 DIAGNOSIS — Z510 Encounter for antineoplastic radiation therapy: Secondary | ICD-10-CM | POA: Diagnosis not present

## 2015-08-03 ENCOUNTER — Ambulatory Visit
Admission: RE | Admit: 2015-08-03 | Discharge: 2015-08-03 | Disposition: A | Discharge status: Home / Self Care | Payer: PPO | Source: Ambulatory Visit | Attending: Radiation Oncology | Admitting: Radiation Oncology

## 2015-08-03 DIAGNOSIS — Z510 Encounter for antineoplastic radiation therapy: Secondary | ICD-10-CM | POA: Diagnosis not present

## 2015-08-03 DIAGNOSIS — C61 Malignant neoplasm of prostate: Secondary | ICD-10-CM | POA: Diagnosis not present

## 2015-08-04 ENCOUNTER — Ambulatory Visit
Admission: RE | Admit: 2015-08-04 | Discharge: 2015-08-04 | Disposition: A | Discharge status: Home / Self Care | Payer: PPO | Source: Ambulatory Visit | Attending: Radiation Oncology | Admitting: Radiation Oncology

## 2015-08-04 DIAGNOSIS — Z510 Encounter for antineoplastic radiation therapy: Secondary | ICD-10-CM | POA: Diagnosis not present

## 2015-08-04 DIAGNOSIS — C61 Malignant neoplasm of prostate: Secondary | ICD-10-CM | POA: Diagnosis not present

## 2015-08-05 ENCOUNTER — Ambulatory Visit
Admission: RE | Admit: 2015-08-05 | Discharge: 2015-08-05 | Disposition: A | Discharge status: Home / Self Care | Payer: PPO | Source: Ambulatory Visit | Attending: Radiation Oncology | Admitting: Radiation Oncology

## 2015-08-05 DIAGNOSIS — C61 Malignant neoplasm of prostate: Secondary | ICD-10-CM | POA: Diagnosis not present

## 2015-08-05 DIAGNOSIS — Z510 Encounter for antineoplastic radiation therapy: Secondary | ICD-10-CM | POA: Diagnosis not present

## 2015-08-08 ENCOUNTER — Ambulatory Visit
Admission: RE | Admit: 2015-08-08 | Discharge: 2015-08-08 | Disposition: A | Discharge status: Home / Self Care | Payer: PPO | Source: Ambulatory Visit | Attending: Radiation Oncology | Admitting: Radiation Oncology

## 2015-08-08 DIAGNOSIS — Z510 Encounter for antineoplastic radiation therapy: Secondary | ICD-10-CM | POA: Diagnosis not present

## 2015-08-08 DIAGNOSIS — C61 Malignant neoplasm of prostate: Secondary | ICD-10-CM | POA: Diagnosis not present

## 2015-08-09 ENCOUNTER — Ambulatory Visit
Admission: RE | Admit: 2015-08-09 | Discharge: 2015-08-09 | Disposition: A | Discharge status: Home / Self Care | Payer: PPO | Source: Ambulatory Visit | Attending: Radiation Oncology | Admitting: Radiation Oncology

## 2015-08-09 ENCOUNTER — Other Ambulatory Visit: Payer: Self-pay | Admitting: *Deleted

## 2015-08-09 DIAGNOSIS — Z510 Encounter for antineoplastic radiation therapy: Secondary | ICD-10-CM | POA: Diagnosis not present

## 2015-08-09 DIAGNOSIS — C61 Malignant neoplasm of prostate: Secondary | ICD-10-CM | POA: Diagnosis not present

## 2015-08-09 MED ORDER — TADALAFIL 5 MG PO TABS: 5.0000 mg | ORAL_TABLET | Freq: Every day | ORAL | Status: DC | PRN

## 2015-08-10 ENCOUNTER — Ambulatory Visit
Admission: RE | Admit: 2015-08-10 | Discharge: 2015-08-10 | Disposition: A | Discharge status: Home / Self Care | Payer: PPO | Source: Ambulatory Visit | Attending: Radiation Oncology | Admitting: Radiation Oncology

## 2015-08-10 DIAGNOSIS — Z510 Encounter for antineoplastic radiation therapy: Secondary | ICD-10-CM | POA: Diagnosis not present

## 2015-08-10 DIAGNOSIS — C61 Malignant neoplasm of prostate: Secondary | ICD-10-CM | POA: Diagnosis not present

## 2015-08-11 ENCOUNTER — Ambulatory Visit
Admission: RE | Admit: 2015-08-11 | Discharge: 2015-08-11 | Disposition: A | Discharge status: Home / Self Care | Payer: PPO | Source: Ambulatory Visit | Attending: Radiation Oncology | Admitting: Radiation Oncology

## 2015-08-11 DIAGNOSIS — C61 Malignant neoplasm of prostate: Secondary | ICD-10-CM | POA: Diagnosis not present

## 2015-08-11 DIAGNOSIS — Z510 Encounter for antineoplastic radiation therapy: Secondary | ICD-10-CM | POA: Diagnosis not present

## 2015-08-12 ENCOUNTER — Ambulatory Visit
Admission: RE | Admit: 2015-08-12 | Discharge: 2015-08-12 | Disposition: A | Discharge status: Home / Self Care | Payer: PPO | Source: Ambulatory Visit | Attending: Radiation Oncology | Admitting: Radiation Oncology

## 2015-08-12 DIAGNOSIS — Z510 Encounter for antineoplastic radiation therapy: Secondary | ICD-10-CM | POA: Diagnosis not present

## 2015-08-12 DIAGNOSIS — C61 Malignant neoplasm of prostate: Secondary | ICD-10-CM | POA: Diagnosis not present

## 2015-08-15 ENCOUNTER — Ambulatory Visit
Admission: RE | Admit: 2015-08-15 | Discharge: 2015-08-15 | Disposition: A | Discharge status: Home / Self Care | Payer: PPO | Source: Ambulatory Visit | Attending: Radiation Oncology | Admitting: Radiation Oncology

## 2015-08-15 ENCOUNTER — Inpatient Hospital Stay: Payer: PPO

## 2015-08-15 DIAGNOSIS — C61 Malignant neoplasm of prostate: Secondary | ICD-10-CM | POA: Diagnosis not present

## 2015-08-15 DIAGNOSIS — Z510 Encounter for antineoplastic radiation therapy: Secondary | ICD-10-CM | POA: Diagnosis not present

## 2015-08-15 LAB — CBC
HCT: 41.9 % (ref 40.0–52.0)
Hemoglobin: 14.6 g/dL (ref 13.0–18.0)
MCH: 32.1 pg (ref 26.0–34.0)
MCHC: 35 g/dL (ref 32.0–36.0)
MCV: 91.9 fL (ref 80.0–100.0)
PLATELETS: 158 10*3/uL (ref 150–440)
RBC: 4.56 MIL/uL (ref 4.40–5.90)
RDW: 15.1 % — AB (ref 11.5–14.5)
WBC: 3.3 10*3/uL — AB (ref 3.8–10.6)

## 2015-08-16 ENCOUNTER — Other Ambulatory Visit: Payer: Self-pay | Admitting: *Deleted

## 2015-08-16 ENCOUNTER — Ambulatory Visit
Admission: RE | Admit: 2015-08-16 | Discharge: 2015-08-16 | Disposition: A | Discharge status: Home / Self Care | Payer: PPO | Source: Ambulatory Visit | Attending: Radiation Oncology | Admitting: Radiation Oncology

## 2015-08-16 DIAGNOSIS — C61 Malignant neoplasm of prostate: Secondary | ICD-10-CM | POA: Diagnosis not present

## 2015-08-16 DIAGNOSIS — Z510 Encounter for antineoplastic radiation therapy: Secondary | ICD-10-CM | POA: Diagnosis not present

## 2015-08-16 MED ORDER — TAMSULOSIN HCL 0.4 MG PO CAPS: 0.4000 mg | ORAL_CAPSULE | Freq: Every day | ORAL | Status: DC

## 2015-08-17 ENCOUNTER — Ambulatory Visit
Admission: RE | Admit: 2015-08-17 | Discharge: 2015-08-17 | Disposition: A | Discharge status: Home / Self Care | Payer: PPO | Source: Ambulatory Visit | Attending: Radiation Oncology | Admitting: Radiation Oncology

## 2015-08-17 ENCOUNTER — Other Ambulatory Visit: Payer: Self-pay | Admitting: *Deleted

## 2015-08-17 DIAGNOSIS — C61 Malignant neoplasm of prostate: Secondary | ICD-10-CM | POA: Diagnosis not present

## 2015-08-17 DIAGNOSIS — Z510 Encounter for antineoplastic radiation therapy: Secondary | ICD-10-CM | POA: Diagnosis not present

## 2015-08-18 ENCOUNTER — Ambulatory Visit
Admission: RE | Admit: 2015-08-18 | Discharge: 2015-08-18 | Disposition: A | Discharge status: Home / Self Care | Payer: PPO | Source: Ambulatory Visit | Attending: Radiation Oncology | Admitting: Radiation Oncology

## 2015-08-18 DIAGNOSIS — Z510 Encounter for antineoplastic radiation therapy: Secondary | ICD-10-CM | POA: Diagnosis not present

## 2015-08-18 DIAGNOSIS — C61 Malignant neoplasm of prostate: Secondary | ICD-10-CM | POA: Diagnosis not present

## 2015-08-19 ENCOUNTER — Ambulatory Visit: Payer: PPO

## 2015-08-19 ENCOUNTER — Ambulatory Visit
Admission: RE | Admit: 2015-08-19 | Discharge: 2015-08-19 | Disposition: A | Discharge status: Home / Self Care | Payer: PPO | Source: Ambulatory Visit | Attending: Radiation Oncology | Admitting: Radiation Oncology

## 2015-08-22 ENCOUNTER — Ambulatory Visit
Admission: RE | Admit: 2015-08-22 | Discharge: 2015-08-22 | Disposition: A | Discharge status: Home / Self Care | Payer: PPO | Source: Ambulatory Visit | Attending: Radiation Oncology | Admitting: Radiation Oncology

## 2015-08-22 DIAGNOSIS — C61 Malignant neoplasm of prostate: Secondary | ICD-10-CM | POA: Diagnosis not present

## 2015-08-22 DIAGNOSIS — Z510 Encounter for antineoplastic radiation therapy: Secondary | ICD-10-CM | POA: Diagnosis not present

## 2015-08-23 ENCOUNTER — Ambulatory Visit
Admission: RE | Admit: 2015-08-23 | Discharge: 2015-08-23 | Disposition: A | Discharge status: Home / Self Care | Payer: PPO | Source: Ambulatory Visit | Attending: Radiation Oncology | Admitting: Radiation Oncology

## 2015-08-23 DIAGNOSIS — C61 Malignant neoplasm of prostate: Secondary | ICD-10-CM | POA: Diagnosis not present

## 2015-08-23 DIAGNOSIS — Z510 Encounter for antineoplastic radiation therapy: Secondary | ICD-10-CM | POA: Diagnosis not present

## 2015-08-24 ENCOUNTER — Ambulatory Visit
Admission: RE | Admit: 2015-08-24 | Discharge: 2015-08-24 | Disposition: A | Discharge status: Home / Self Care | Payer: PPO | Source: Ambulatory Visit | Attending: Radiation Oncology | Admitting: Radiation Oncology

## 2015-08-24 DIAGNOSIS — C61 Malignant neoplasm of prostate: Secondary | ICD-10-CM | POA: Diagnosis not present

## 2015-08-24 DIAGNOSIS — Z510 Encounter for antineoplastic radiation therapy: Secondary | ICD-10-CM | POA: Diagnosis not present

## 2015-08-25 ENCOUNTER — Ambulatory Visit
Admission: RE | Admit: 2015-08-25 | Discharge: 2015-08-25 | Disposition: A | Discharge status: Home / Self Care | Payer: PPO | Source: Ambulatory Visit | Attending: Radiation Oncology | Admitting: Radiation Oncology

## 2015-08-25 DIAGNOSIS — Z510 Encounter for antineoplastic radiation therapy: Secondary | ICD-10-CM | POA: Diagnosis not present

## 2015-08-25 DIAGNOSIS — C61 Malignant neoplasm of prostate: Secondary | ICD-10-CM | POA: Diagnosis not present

## 2015-08-26 ENCOUNTER — Ambulatory Visit
Admission: RE | Admit: 2015-08-26 | Discharge: 2015-08-26 | Disposition: A | Discharge status: Home / Self Care | Payer: PPO | Source: Ambulatory Visit | Attending: Radiation Oncology | Admitting: Radiation Oncology

## 2015-08-26 DIAGNOSIS — Z510 Encounter for antineoplastic radiation therapy: Secondary | ICD-10-CM | POA: Diagnosis not present

## 2015-08-26 DIAGNOSIS — C61 Malignant neoplasm of prostate: Secondary | ICD-10-CM | POA: Diagnosis not present

## 2015-08-29 ENCOUNTER — Ambulatory Visit: Payer: PPO

## 2015-08-29 ENCOUNTER — Ambulatory Visit
Admission: RE | Admit: 2015-08-29 | Discharge: 2015-08-29 | Disposition: A | Discharge status: Home / Self Care | Payer: PPO | Source: Ambulatory Visit | Attending: Radiation Oncology | Admitting: Radiation Oncology

## 2015-08-29 DIAGNOSIS — C61 Malignant neoplasm of prostate: Secondary | ICD-10-CM | POA: Diagnosis not present

## 2015-08-29 DIAGNOSIS — Z510 Encounter for antineoplastic radiation therapy: Secondary | ICD-10-CM | POA: Diagnosis not present

## 2015-08-30 ENCOUNTER — Ambulatory Visit
Admission: RE | Admit: 2015-08-30 | Discharge: 2015-08-30 | Disposition: A | Discharge status: Home / Self Care | Payer: PPO | Source: Ambulatory Visit | Attending: Radiation Oncology | Admitting: Radiation Oncology

## 2015-10-07 ENCOUNTER — Other Ambulatory Visit: Payer: Self-pay | Admitting: *Deleted

## 2015-10-07 ENCOUNTER — Ambulatory Visit
Admission: RE | Admit: 2015-10-07 | Discharge: 2015-10-07 | Disposition: A | Discharge status: Home / Self Care | Payer: PPO | Source: Ambulatory Visit | Attending: Radiation Oncology | Admitting: Radiation Oncology

## 2015-10-07 ENCOUNTER — Encounter: Payer: Self-pay | Admitting: Radiation Oncology

## 2015-10-07 VITALS — BP 142/91 | HR 70 | Temp 97.3°F | Resp 20 | Wt 147.5 lb

## 2015-10-07 DIAGNOSIS — C61 Malignant neoplasm of prostate: Secondary | ICD-10-CM

## 2015-10-07 MED ORDER — TADALAFIL 5 MG PO TABS: 5.0000 mg | ORAL_TABLET | Freq: Every day | ORAL | Status: DC | PRN

## 2015-10-07 NOTE — Progress Notes (Signed)
Radiation Oncology Follow up Note  Name: Mario Hernandez   Date:   10/07/2015 MRN:  [id number] DOB: 08/03/1950    This 65 y.o. male presents to the clinic today for one-month follow-up status post radiation therapy for prostate cancer.  REFERRING PROVIDER: Tracie Harrier, MD  HPI: Patient is a 65 year old male now one month out having completed radiation therapy to his prostate for a Gleason 7 (4+3) adenocarcinoma the prostate presenting with a PSA of 12.3. He had green light therapy 2013.. We treated both his prostate and pelvic nodes. He is seen today in follow-up and is doing well he states he is having very little diarrhea dysuria or any other GI/GU complaints. He has nocturia 1. He currently is taking Cialis on a regular basis for erectile dysfunction which seems to be working well.  COMPLICATIONS OF TREATMENT: none  FOLLOW UP COMPLIANCE: keeps appointments   PHYSICAL EXAM:  BP 142/91 mmHg  Pulse 70  Temp(Src) 97.3 F (36.3 C)  Resp 20  Wt 147 lb 7.8 oz (66.9 kg) On rectal exam rectal sphincter tone is good. Prostate is smooth contracted without evidence of nodularity or mass. Sulcus is preserved bilaterally. No discrete nodularity is identified. No other rectal abnormalities are noted. Well-developed well-nourished patient in NAD. HEENT reveals PERLA, EOMI, discs not visualized.  Oral cavity is clear. No oral mucosal lesions are identified. Neck is clear without evidence of cervical or supraclavicular adenopathy. Lungs are clear to A&P. Cardiac examination is essentially unremarkable with regular rate and rhythm without murmur rub or thrill. Abdomen is benign with no organomegaly or masses noted. Motor sensory and DTR levels are equal and symmetric in the upper and lower extremities. Cranial nerves II through XII are grossly intact. Proprioception is intact. No peripheral adenopathy or edema is identified. No motor or sensory levels are noted. Crude visual fields are within normal  range.  RADIOLOGY RESULTS: No current films for review  PLAN: Present time patient is doing well recovering nicely from radiation therapy 1 month out with very little side effect profile. I've set up a follow-up in 3 months and will have a PSA drawn at that time. I've explained the indications of PSA what we expect in the future. I've also refilled his  Cialis . Patient knows to call sooner with any concerns.  I would like to take this opportunity to thank you for allowing me to participate in the care of your patient.Armstead Peaks., MD

## 2015-11-10 DIAGNOSIS — M1611 Unilateral primary osteoarthritis, right hip: Secondary | ICD-10-CM | POA: Diagnosis not present

## 2015-11-10 DIAGNOSIS — I10 Essential (primary) hypertension: Secondary | ICD-10-CM | POA: Diagnosis not present

## 2015-11-10 DIAGNOSIS — C801 Malignant (primary) neoplasm, unspecified: Secondary | ICD-10-CM | POA: Diagnosis not present

## 2015-11-10 DIAGNOSIS — M25551 Pain in right hip: Secondary | ICD-10-CM | POA: Diagnosis not present

## 2015-11-10 DIAGNOSIS — L84 Corns and callosities: Secondary | ICD-10-CM | POA: Diagnosis not present

## 2015-11-10 DIAGNOSIS — F17200 Nicotine dependence, unspecified, uncomplicated: Secondary | ICD-10-CM | POA: Diagnosis not present

## 2015-12-14 ENCOUNTER — Ambulatory Visit: Payer: PPO | Admitting: Urology

## 2016-01-19 ENCOUNTER — Ambulatory Visit
Admission: RE | Admit: 2016-01-19 | Discharge: 2016-01-19 | Disposition: A | Discharge status: Home / Self Care | Payer: PPO | Source: Ambulatory Visit | Attending: Radiation Oncology | Admitting: Radiation Oncology

## 2016-01-19 ENCOUNTER — Other Ambulatory Visit: Payer: Self-pay | Admitting: *Deleted

## 2016-01-19 ENCOUNTER — Inpatient Hospital Stay: Payer: PPO | Attending: Radiation Oncology

## 2016-01-19 ENCOUNTER — Encounter: Payer: Self-pay | Admitting: Radiation Oncology

## 2016-01-19 ENCOUNTER — Other Ambulatory Visit: Payer: Self-pay

## 2016-01-19 VITALS — BP 158/90 | HR 96 | Temp 96.0°F | Resp 20 | Wt 150.5 lb

## 2016-01-19 DIAGNOSIS — C61 Malignant neoplasm of prostate: Secondary | ICD-10-CM | POA: Diagnosis not present

## 2016-01-19 DIAGNOSIS — Z923 Personal history of irradiation: Secondary | ICD-10-CM | POA: Insufficient documentation

## 2016-01-19 LAB — PSA: PSA: 1.72 ng/mL (ref 0.00–4.00)

## 2016-01-19 NOTE — Progress Notes (Signed)
Radiation Oncology Follow up Note  Name: Mario Hernandez   Date:   01/19/2016 MRN:  [id number] DOB: 1950-09-24    This 65 y.o. male presents to the clinic today for adenocarcinoma the prostate now out 4 months from I MRT radiation therapy.  REFERRING PROVIDER: Tracie Harrier, MD  HPI: Patient is a 65 year old male now out 4 months having completed radiation therapy to his prostate for Gleason 7 (4+3) adenocarcinoma presenting the PSA of 12.3. He received radiation to his prostate and pelvic nodes is seen in routine follow-up is doing well specifically denies diarrhea dysuria or any other GI/GU complaints. We drew a PSA level today..  COMPLICATIONS OF TREATMENT: none  FOLLOW UP COMPLIANCE: keeps appointments   PHYSICAL EXAM:  BP (!) 158/90   Pulse 96   Temp (!) 96 F (35.6 C)   Resp 20   Wt 150 lb 7.4 oz (68.2 kg)   BMI 22.88 kg/m  On rectal exam rectal sphincter tone is good. Prostate is smooth contracted without evidence of nodularity or mass. Sulcus is preserved bilaterally. No discrete nodularity is identified. No other rectal abnormalities are noted. Well-developed well-nourished patient in NAD. HEENT reveals PERLA, EOMI, discs not visualized.  Oral cavity is clear. No oral mucosal lesions are identified. Neck is clear without evidence of cervical or supraclavicular adenopathy. Lungs are clear to A&P. Cardiac examination is essentially unremarkable with regular rate and rhythm without murmur rub or thrill. Abdomen is benign with no organomegaly or masses noted. Motor sensory and DTR levels are equal and symmetric in the upper and lower extremities. Cranial nerves II through XII are grossly intact. Proprioception is intact. No peripheral adenopathy or edema is identified. No motor or sensory levels are noted. Crude visual fields are within normal range.  RADIOLOGY RESULTS: No current films for review  PLAN: At the present time clinically he is doing well. I've run a PSA level on on  him today and will report that separately. Will see him back in 6 months and check his PSA again at that time. Patient is to call with any concerns. I'm please was overall progress.  I would like to take this opportunity to thank you for allowing me to participate in the care of your patient.Armstead Peaks., MD

## 2016-06-01 DIAGNOSIS — M25551 Pain in right hip: Secondary | ICD-10-CM | POA: Diagnosis not present

## 2016-06-01 DIAGNOSIS — I10 Essential (primary) hypertension: Secondary | ICD-10-CM | POA: Diagnosis not present

## 2016-06-01 DIAGNOSIS — L84 Corns and callosities: Secondary | ICD-10-CM | POA: Diagnosis not present

## 2016-06-01 DIAGNOSIS — F17200 Nicotine dependence, unspecified, uncomplicated: Secondary | ICD-10-CM | POA: Diagnosis not present

## 2016-06-01 DIAGNOSIS — Z125 Encounter for screening for malignant neoplasm of prostate: Secondary | ICD-10-CM | POA: Diagnosis not present

## 2016-06-01 DIAGNOSIS — C801 Malignant (primary) neoplasm, unspecified: Secondary | ICD-10-CM | POA: Diagnosis not present

## 2016-06-08 DIAGNOSIS — G5790 Unspecified mononeuropathy of unspecified lower limb: Secondary | ICD-10-CM | POA: Diagnosis not present

## 2016-06-08 DIAGNOSIS — I10 Essential (primary) hypertension: Secondary | ICD-10-CM | POA: Diagnosis not present

## 2016-06-08 DIAGNOSIS — F17200 Nicotine dependence, unspecified, uncomplicated: Secondary | ICD-10-CM | POA: Diagnosis not present

## 2016-06-08 DIAGNOSIS — C801 Malignant (primary) neoplasm, unspecified: Secondary | ICD-10-CM | POA: Diagnosis not present

## 2016-06-08 DIAGNOSIS — H9312 Tinnitus, left ear: Secondary | ICD-10-CM | POA: Diagnosis not present

## 2016-06-08 DIAGNOSIS — Z0000 Encounter for general adult medical examination without abnormal findings: Secondary | ICD-10-CM | POA: Diagnosis not present

## 2016-07-09 ENCOUNTER — Other Ambulatory Visit: Payer: Self-pay | Admitting: *Deleted

## 2016-07-11 ENCOUNTER — Inpatient Hospital Stay: Payer: PPO | Attending: Radiation Oncology

## 2016-07-11 DIAGNOSIS — C61 Malignant neoplasm of prostate: Secondary | ICD-10-CM | POA: Diagnosis not present

## 2016-07-11 LAB — PSA: PSA: 1.89 ng/mL (ref 0.00–4.00)

## 2016-07-18 ENCOUNTER — Ambulatory Visit
Admission: RE | Admit: 2016-07-18 | Discharge: 2016-07-18 | Disposition: A | Discharge status: Home / Self Care | Payer: PPO | Source: Ambulatory Visit | Attending: Radiation Oncology | Admitting: Radiation Oncology

## 2016-07-18 ENCOUNTER — Encounter: Payer: Self-pay | Admitting: Radiation Oncology

## 2016-07-18 ENCOUNTER — Other Ambulatory Visit: Payer: Self-pay | Admitting: *Deleted

## 2016-07-18 VITALS — BP 136/85 | HR 80 | Temp 96.8°F | Resp 20 | Wt 153.8 lb

## 2016-07-18 DIAGNOSIS — Z923 Personal history of irradiation: Secondary | ICD-10-CM | POA: Insufficient documentation

## 2016-07-18 DIAGNOSIS — C61 Malignant neoplasm of prostate: Secondary | ICD-10-CM

## 2016-07-18 NOTE — Progress Notes (Signed)
Radiation Oncology Follow up Note  Name: Mario Hernandez   Date:   07/18/2016 MRN:  [id number] DOB: 06/14/1950    This 66 y.o. male presents to the clinic today for 10 month follow-up status post I MRT radiation therapy for Gleason 7 adenocarcinoma the prostate  REFERRING PROVIDER: Tracie Harrier, MD  HPI: Patient is a 66 year old male now out 10 months having completed IM RT radiation therapy for adenocarcinoma the prostate Gleason score 7 (4+3) presenting the PSA of 12.3. He received radiation to both his prostate and pelvic nodes seen today in routine follow-up he is doing well specifically denies diarrhea dysuria or any other GI/GU complaints.. His PSA remains stable from 6 months ago at 1.9.  COMPLICATIONS OF TREATMENT: none FOLLOW UP COMPLIANCE: keeps appointments   PHYSICAL EXAM:  BP 136/85   Pulse 80   Temp (!) 96.8 F (36 C)   Resp 20   Wt 153 lb 12.3 oz (69.7 kg)   BMI 23.38 kg/m  Well-developed well-nourished patient in NAD. HEENT reveals PERLA, EOMI, discs not visualized.  Oral cavity is clear. No oral mucosal lesions are identified. Neck is clear without evidence of cervical or supraclavicular adenopathy. Lungs are clear to A&P. Cardiac examination is essentially unremarkable with regular rate and rhythm without murmur rub or thrill. Abdomen is benign with no organomegaly or masses noted. Motor sensory and DTR levels are equal and symmetric in the upper and lower extremities. Cranial nerves II through XII are grossly intact. Proprioception is intact. No peripheral adenopathy or edema is identified. No motor or sensory levels are noted. Crude visual fields are within normal range.  RADIOLOGY RESULTS: No current films for review  PLAN: Present time patient is doing well with no evidence of disease now 10 months out. I'll start see him once year for follow-up. Patient is to call sooner with any concerns. I'm please was overall progress.  I would like to take this opportunity  to thank you for allowing me to participate in the care of your patient.Armstead Peaks., MD

## 2016-12-11 DIAGNOSIS — G5791 Unspecified mononeuropathy of right lower limb: Secondary | ICD-10-CM | POA: Diagnosis not present

## 2016-12-11 DIAGNOSIS — I10 Essential (primary) hypertension: Secondary | ICD-10-CM | POA: Diagnosis not present

## 2016-12-11 DIAGNOSIS — F17200 Nicotine dependence, unspecified, uncomplicated: Secondary | ICD-10-CM | POA: Diagnosis not present

## 2016-12-11 DIAGNOSIS — C801 Malignant (primary) neoplasm, unspecified: Secondary | ICD-10-CM | POA: Diagnosis not present

## 2016-12-11 DIAGNOSIS — L989 Disorder of the skin and subcutaneous tissue, unspecified: Secondary | ICD-10-CM | POA: Diagnosis not present

## 2016-12-11 DIAGNOSIS — Z0000 Encounter for general adult medical examination without abnormal findings: Secondary | ICD-10-CM | POA: Diagnosis not present

## 2016-12-11 DIAGNOSIS — G5792 Unspecified mononeuropathy of left lower limb: Secondary | ICD-10-CM | POA: Diagnosis not present

## 2017-01-09 ENCOUNTER — Inpatient Hospital Stay: Payer: PPO | Attending: Radiation Oncology

## 2017-01-09 DIAGNOSIS — C61 Malignant neoplasm of prostate: Secondary | ICD-10-CM | POA: Insufficient documentation

## 2017-01-09 LAB — PSA: PROSTATIC SPECIFIC ANTIGEN: 0.66 ng/mL (ref 0.00–4.00)

## 2017-01-16 ENCOUNTER — Encounter: Payer: Self-pay | Admitting: Radiation Oncology

## 2017-01-16 ENCOUNTER — Ambulatory Visit
Admission: RE | Admit: 2017-01-16 | Discharge: 2017-01-16 | Disposition: A | Discharge status: Home / Self Care | Payer: PPO | Source: Ambulatory Visit | Attending: Radiation Oncology | Admitting: Radiation Oncology

## 2017-01-16 ENCOUNTER — Other Ambulatory Visit: Payer: Self-pay | Admitting: *Deleted

## 2017-01-16 VITALS — BP 144/90 | HR 69 | Temp 97.0°F | Resp 22 | Wt 156.0 lb

## 2017-01-16 DIAGNOSIS — R350 Frequency of micturition: Secondary | ICD-10-CM | POA: Diagnosis not present

## 2017-01-16 DIAGNOSIS — Z923 Personal history of irradiation: Secondary | ICD-10-CM | POA: Diagnosis not present

## 2017-01-16 DIAGNOSIS — R3915 Urgency of urination: Secondary | ICD-10-CM | POA: Diagnosis not present

## 2017-01-16 DIAGNOSIS — C61 Malignant neoplasm of prostate: Secondary | ICD-10-CM

## 2017-01-16 MED ORDER — TAMSULOSIN HCL 0.4 MG PO CAPS: 0.4000 mg | ORAL_CAPSULE | Freq: Every day | ORAL | 12 refills | Status: AC

## 2017-01-16 NOTE — Progress Notes (Signed)
Radiation Oncology Follow up Note  Name: Mario Hernandez   Date:   01/16/2017 MRN:  [id number] DOB: 10-Jan-1951    This 66 y.o. male presents to the clinic today for 40 month follow-up status post I MRT radiation therapy for Gleason 7 adenocarcinoma the prostate.  REFERRING PROVIDER: Tracie Harrier, MD  HPI: patient is a 66 year old male now out for 2 months having completed IM RT radiation therapy for Gleason 7 (4+3) adenocarcinoma the prostate presenting the PSA of 12.3. He received radiation to his prostate and pelvic nodes and is seen today in routine follow-up he is doing well still has some urgency and frequency of urination has requested a refill of his Flomax. His most recent PSA is stable at 0.66.Marland Kitchen  COMPLICATIONS OF TREATMENT: none  FOLLOW UP COMPLIANCE: keeps appointments   PHYSICAL EXAM:  BP (!) 144/90   Pulse 69   Temp (!) 97 F (36.1 C)   Resp (!) 22   Wt 155 lb 15.6 oz (70.8 kg)   BMI 23.72 kg/m  Well-developed well-nourished patient in NAD. HEENT reveals PERLA, EOMI, discs not visualized.  Oral cavity is clear. No oral mucosal lesions are identified. Neck is clear without evidence of cervical or supraclavicular adenopathy. Lungs are clear to A&P. Cardiac examination is essentially unremarkable with regular rate and rhythm without murmur rub or thrill. Abdomen is benign with no organomegaly or masses noted. Motor sensory and DTR levels are equal and symmetric in the upper and lower extremities. Cranial nerves II through XII are grossly intact. Proprioception is intact. No peripheral adenopathy or edema is identified. No motor or sensory levels are noted. Crude visual fields are within normal range.  RADIOLOGY RESULTS: no current films for review  PLAN: at the present time patient is doing well under good biochemical control of his prostate cancer. I'm please was overall progress. I've asked to see him back in 1 year for follow-up. I'll obtain a PSA prior to that visit.  Patient is to call with any concerns.  I would like to take this opportunity to thank you for allowing me to participate in the care of your patient.Armstead Peaks., MD

## 2017-01-17 DIAGNOSIS — H2513 Age-related nuclear cataract, bilateral: Secondary | ICD-10-CM | POA: Diagnosis not present

## 2017-02-15 DIAGNOSIS — D485 Neoplasm of uncertain behavior of skin: Secondary | ICD-10-CM | POA: Diagnosis not present

## 2017-02-15 DIAGNOSIS — L281 Prurigo nodularis: Secondary | ICD-10-CM | POA: Diagnosis not present

## 2017-06-13 DIAGNOSIS — I10 Essential (primary) hypertension: Secondary | ICD-10-CM | POA: Diagnosis not present

## 2017-06-13 DIAGNOSIS — C801 Malignant (primary) neoplasm, unspecified: Secondary | ICD-10-CM | POA: Diagnosis not present

## 2017-06-13 DIAGNOSIS — M545 Low back pain: Secondary | ICD-10-CM | POA: Diagnosis not present

## 2017-06-13 DIAGNOSIS — F17200 Nicotine dependence, unspecified, uncomplicated: Secondary | ICD-10-CM | POA: Diagnosis not present

## 2017-06-13 DIAGNOSIS — Z0000 Encounter for general adult medical examination without abnormal findings: Secondary | ICD-10-CM | POA: Diagnosis not present

## 2017-06-13 DIAGNOSIS — G8929 Other chronic pain: Secondary | ICD-10-CM | POA: Diagnosis not present

## 2017-12-19 DIAGNOSIS — G8929 Other chronic pain: Secondary | ICD-10-CM | POA: Diagnosis not present

## 2017-12-19 DIAGNOSIS — C801 Malignant (primary) neoplasm, unspecified: Secondary | ICD-10-CM | POA: Diagnosis not present

## 2017-12-19 DIAGNOSIS — F17200 Nicotine dependence, unspecified, uncomplicated: Secondary | ICD-10-CM | POA: Diagnosis not present

## 2017-12-19 DIAGNOSIS — M545 Low back pain: Secondary | ICD-10-CM | POA: Diagnosis not present

## 2017-12-19 DIAGNOSIS — R8290 Unspecified abnormal findings in urine: Secondary | ICD-10-CM | POA: Diagnosis not present

## 2017-12-19 DIAGNOSIS — I10 Essential (primary) hypertension: Secondary | ICD-10-CM | POA: Diagnosis not present

## 2017-12-19 DIAGNOSIS — Z0000 Encounter for general adult medical examination without abnormal findings: Secondary | ICD-10-CM | POA: Diagnosis not present

## 2017-12-26 DIAGNOSIS — I10 Essential (primary) hypertension: Secondary | ICD-10-CM | POA: Diagnosis not present

## 2017-12-26 DIAGNOSIS — C801 Malignant (primary) neoplasm, unspecified: Secondary | ICD-10-CM | POA: Diagnosis not present

## 2017-12-26 DIAGNOSIS — F17200 Nicotine dependence, unspecified, uncomplicated: Secondary | ICD-10-CM | POA: Diagnosis not present

## 2018-01-08 ENCOUNTER — Inpatient Hospital Stay: Payer: PPO | Attending: Radiation Oncology

## 2018-01-08 DIAGNOSIS — C61 Malignant neoplasm of prostate: Secondary | ICD-10-CM | POA: Diagnosis not present

## 2018-01-08 LAB — PSA: PROSTATIC SPECIFIC ANTIGEN: 0.25 ng/mL (ref 0.00–4.00)

## 2018-01-15 ENCOUNTER — Ambulatory Visit: Payer: PPO | Attending: Radiation Oncology | Admitting: Radiation Oncology

## 2018-01-16 ENCOUNTER — Other Ambulatory Visit: Payer: Self-pay

## 2018-01-16 ENCOUNTER — Ambulatory Visit
Admission: RE | Admit: 2018-01-16 | Discharge: 2018-01-16 | Disposition: A | Discharge status: Home / Self Care | Payer: PPO | Source: Ambulatory Visit | Attending: Radiation Oncology | Admitting: Radiation Oncology

## 2018-01-16 ENCOUNTER — Other Ambulatory Visit: Payer: Self-pay | Admitting: *Deleted

## 2018-01-16 ENCOUNTER — Encounter: Payer: Self-pay | Admitting: Radiation Oncology

## 2018-01-16 VITALS — BP 142/93 | HR 76 | Temp 97.5°F | Wt 149.1 lb

## 2018-01-16 DIAGNOSIS — C61 Malignant neoplasm of prostate: Secondary | ICD-10-CM

## 2018-01-16 DIAGNOSIS — Z923 Personal history of irradiation: Secondary | ICD-10-CM | POA: Diagnosis not present

## 2018-01-16 NOTE — Progress Notes (Signed)
Radiation Oncology Follow up Note  Name: Mario Hernandez   Date:   01/16/2018 MRN:  [id number] DOB: 10/17/1950    This 67 y.o. male presents to the clinic today for to a half year follow-up status post IM RT radiation therapy for Gleason 7 (4+3) adenocarcinoma the prostate.  REFERRING PROVIDER: Tracie Harrier, MD  HPI: patient is a 67 year old male now seen out 2-1/2 years having completed IM RT radiation therapy for Gleason 7 (4+3) adenocarcinoma the prostate presenting with a PSA of 12.3. He is seen today in routine follow-up is doing well. She does have some increased frequency and urgency especially in the morning nocturia 2-3. No GI problems. His most recent PSA is 0.25.Marland Kitchen  COMPLICATIONS OF TREATMENT: none  FOLLOW UP COMPLIANCE: keeps appointments   PHYSICAL EXAM:  BP (!) 142/93 (BP Location: Left Arm, Patient Position: Sitting)   Pulse 76   Temp (!) 97.5 F (36.4 C) (Tympanic)   Wt 149 lb 2.3 oz (67.7 kg)   BMI 22.68 kg/m  Well-developed well-nourished patient in NAD. HEENT reveals PERLA, EOMI, discs not visualized.  Oral cavity is clear. No oral mucosal lesions are identified. Neck is clear without evidence of cervical or supraclavicular adenopathy. Lungs are clear to A&P. Cardiac examination is essentially unremarkable with regular rate and rhythm without murmur rub or thrill. Abdomen is benign with no organomegaly or masses noted. Motor sensory and DTR levels are equal and symmetric in the upper and lower extremities. Cranial nerves II through XII are grossly intact. Proprioception is intact. No peripheral adenopathy or edema is identified. No motor or sensory levels are noted. Crude visual fields are within normal range.  RADIOLOGY RESULTS: no current films for review  PLAN: present time patient is under excellent biochemical control of his prostate cancer I'm please was overall progress. I've asked to see him back in 1 year for follow-up with a PSA prior to that visit.  Patient is to call sooner with any concerns.  I would like to take this opportunity to thank you for allowing me to participate in the care of your patient.Noreene Filbert, MD

## 2018-06-19 DIAGNOSIS — C801 Malignant (primary) neoplasm, unspecified: Secondary | ICD-10-CM | POA: Diagnosis not present

## 2018-06-19 DIAGNOSIS — F17200 Nicotine dependence, unspecified, uncomplicated: Secondary | ICD-10-CM | POA: Diagnosis not present

## 2018-06-19 DIAGNOSIS — I10 Essential (primary) hypertension: Secondary | ICD-10-CM | POA: Diagnosis not present

## 2018-06-26 DIAGNOSIS — F17200 Nicotine dependence, unspecified, uncomplicated: Secondary | ICD-10-CM | POA: Diagnosis not present

## 2018-06-26 DIAGNOSIS — Z0000 Encounter for general adult medical examination without abnormal findings: Secondary | ICD-10-CM | POA: Diagnosis not present

## 2018-06-26 DIAGNOSIS — I10 Essential (primary) hypertension: Secondary | ICD-10-CM | POA: Diagnosis not present

## 2018-06-26 DIAGNOSIS — Z8546 Personal history of malignant neoplasm of prostate: Secondary | ICD-10-CM | POA: Diagnosis not present

## 2018-12-25 DIAGNOSIS — Z8546 Personal history of malignant neoplasm of prostate: Secondary | ICD-10-CM | POA: Diagnosis not present

## 2018-12-25 DIAGNOSIS — F17200 Nicotine dependence, unspecified, uncomplicated: Secondary | ICD-10-CM | POA: Diagnosis not present

## 2018-12-25 DIAGNOSIS — I10 Essential (primary) hypertension: Secondary | ICD-10-CM | POA: Diagnosis not present

## 2018-12-25 DIAGNOSIS — Z0000 Encounter for general adult medical examination without abnormal findings: Secondary | ICD-10-CM | POA: Diagnosis not present

## 2018-12-30 DIAGNOSIS — R911 Solitary pulmonary nodule: Secondary | ICD-10-CM | POA: Diagnosis not present

## 2018-12-30 DIAGNOSIS — F17200 Nicotine dependence, unspecified, uncomplicated: Secondary | ICD-10-CM | POA: Diagnosis not present

## 2018-12-30 DIAGNOSIS — I10 Essential (primary) hypertension: Secondary | ICD-10-CM | POA: Diagnosis not present

## 2018-12-30 DIAGNOSIS — C61 Malignant neoplasm of prostate: Secondary | ICD-10-CM | POA: Diagnosis not present

## 2019-01-02 ENCOUNTER — Telehealth: Payer: Self-pay | Admitting: *Deleted

## 2019-01-02 DIAGNOSIS — Z122 Encounter for screening for malignant neoplasm of respiratory organs: Secondary | ICD-10-CM

## 2019-01-02 DIAGNOSIS — Z87891 Personal history of nicotine dependence: Secondary | ICD-10-CM

## 2019-01-02 NOTE — Telephone Encounter (Signed)
Received referral for initial lung cancer screening scan. Contacted patient and obtained smoking history,(current, 50 pack year) as well as answering questions related to screening process. Patient denies signs of lung cancer such as weight loss or hemoptysis. Patient denies comorbidity that would prevent curative treatment if lung cancer were found. Patient is scheduled for shared decision making visit and CT scan on 01/08/19 at 145pm.

## 2019-01-06 ENCOUNTER — Encounter: Payer: Self-pay | Admitting: Nurse Practitioner

## 2019-01-08 ENCOUNTER — Inpatient Hospital Stay: Payer: PPO | Attending: Nurse Practitioner | Admitting: Nurse Practitioner

## 2019-01-08 ENCOUNTER — Ambulatory Visit
Admission: RE | Admit: 2019-01-08 | Discharge: 2019-01-08 | Disposition: A | Discharge status: Home / Self Care | Payer: PPO | Source: Ambulatory Visit | Attending: Nurse Practitioner | Admitting: Nurse Practitioner

## 2019-01-08 ENCOUNTER — Other Ambulatory Visit: Payer: Self-pay

## 2019-01-08 DIAGNOSIS — Z87891 Personal history of nicotine dependence: Secondary | ICD-10-CM

## 2019-01-08 DIAGNOSIS — F17210 Nicotine dependence, cigarettes, uncomplicated: Secondary | ICD-10-CM | POA: Insufficient documentation

## 2019-01-08 DIAGNOSIS — Z122 Encounter for screening for malignant neoplasm of respiratory organs: Secondary | ICD-10-CM | POA: Insufficient documentation

## 2019-01-08 NOTE — Progress Notes (Signed)
Virtual Visit via Video Enabled Telemedicine Note   I connected with Mario Hernandez on 01/08/19 at 1:45 PM EST by video enabled telemedicine visit and verified that I am speaking with the correct person using two identifiers.   I discussed the limitations, risks, security and privacy concerns of performing an evaluation and management service by telemedicine and the availability of in-person appointments. I also discussed with the patient that there may be a patient responsible charge related to this service. The patient expressed understanding and agreed to proceed.   Other persons participating in the visit and their role in the encounter: Burgess Estelle, RN- checking in patient & navigation  Patient's location: imaging center  Provider's location: clinic  Chief Complaint: Low Dose CT Screening  Patient agreed to evaluation by telemedicine to discuss shared decision making for consideration of low dose CT lung cancer screening.    In accordance with CMS guidelines, patient has met eligibility criteria including age, absence of signs or symptoms of lung cancer.  Social History   Tobacco Use  . Smoking status: Current Every Day Smoker    Packs/day: 1.00    Years: 50.00    Pack years: 50.00    Types: Cigarettes  . Smokeless tobacco: Never Used  Substance Use Topics  . Alcohol use: Yes    Alcohol/week: 0.0 standard drinks     A shared decision-making session was conducted prior to the performance of CT scan. This includes one or more decision aids, includes benefits and harms of screening, follow-up diagnostic testing, over-diagnosis, false positive rate, and total radiation exposure.   Counseling on the importance of adherence to annual lung cancer LDCT screening, impact of co-morbidities, and ability or willingness to undergo diagnosis and treatment is imperative for compliance of the program.   Counseling on the importance of continued smoking cessation for former smokers; the  importance of smoking cessation for current smokers, and information about tobacco cessation interventions have been given to patient including [place] and [employer] [place] programs.   Written order for lung cancer screening with LDCT has been given to the patient and any and all questions have been answered to the best of my abilities.    Yearly follow up will be coordinated by Burgess Estelle, Thoracic Navigator.  I discussed the assessment and treatment plan with the patient. The patient was provided an opportunity to ask questions and all were answered. The patient agreed with the plan and demonstrated an understanding of the instructions.   The patient was advised to call back or seek an in-person evaluation if the symptoms worsen or if the condition fails to improve as anticipated.   I provided 15 minutes of face-to-face video visit time during this encounter, and > 50% was spent counseling as documented under my assessment & plan.   Beckey Rutter, DNP, AGNP-C [place] [facility] [phone] (work cell) [phone] (office)

## 2019-01-12 ENCOUNTER — Encounter: Payer: Self-pay | Admitting: *Deleted

## 2019-01-15 ENCOUNTER — Other Ambulatory Visit: Payer: Self-pay

## 2019-01-15 ENCOUNTER — Inpatient Hospital Stay: Payer: PPO

## 2019-01-15 DIAGNOSIS — C61 Malignant neoplasm of prostate: Secondary | ICD-10-CM

## 2019-01-15 DIAGNOSIS — Z122 Encounter for screening for malignant neoplasm of respiratory organs: Secondary | ICD-10-CM | POA: Diagnosis not present

## 2019-01-15 DIAGNOSIS — F17210 Nicotine dependence, cigarettes, uncomplicated: Secondary | ICD-10-CM | POA: Diagnosis not present

## 2019-01-15 LAB — PSA: Prostatic Specific Antigen: 0.41 ng/mL (ref 0.00–4.00)

## 2019-01-22 ENCOUNTER — Other Ambulatory Visit: Payer: Self-pay

## 2019-01-22 ENCOUNTER — Other Ambulatory Visit: Payer: Self-pay | Admitting: *Deleted

## 2019-01-22 ENCOUNTER — Ambulatory Visit
Admission: RE | Admit: 2019-01-22 | Discharge: 2019-01-22 | Disposition: A | Discharge status: Home / Self Care | Payer: PPO | Source: Ambulatory Visit | Attending: Radiation Oncology | Admitting: Radiation Oncology

## 2019-01-22 VITALS — BP 152/83 | HR 71 | Temp 98.5°F | Resp 18 | Wt 152.5 lb

## 2019-01-22 DIAGNOSIS — Z923 Personal history of irradiation: Secondary | ICD-10-CM | POA: Diagnosis not present

## 2019-01-22 DIAGNOSIS — C61 Malignant neoplasm of prostate: Secondary | ICD-10-CM | POA: Diagnosis not present

## 2019-01-22 NOTE — Progress Notes (Signed)
Radiation Oncology Follow up Note  Name: Mario Hernandez   Date:   01/22/2019 MRN:  [id number] DOB: [birth year]    This 68 y.o. male presents to the clinic today for 3-1/2-year follow-up status post IMRT radiation therapy for Gleason 7 (4+3) adenocarcinoma of the prostate.  REFERRING PROVIDER: Tracie Harrier, MD  HPI: Patient is a 67 year old male now seen out 3-1/2 years having completed IMRT radiation therapy to his prostate for Gleason 7 (4+3) adenocarcinoma the prostate presenting with a PSA of 12.3.  He is seen today in routine follow-up and is doing well specifically denies any increased lower urinary tract symptoms nocturia x2 and no diarrhea.  His most recent PSA was 0.41 up slightly from 0.251-year prior.  Patient had lung cancer screening last month which I have reviewed showing no evidence of chest pathology  COMPLICATIONS OF TREATMENT: none  FOLLOW UP COMPLIANCE: keeps appointments   PHYSICAL EXAM:  BP (!) 152/83 (Patient Position: Sitting)   Pulse 71   Temp 98.5 F (36.9 C) (Tympanic)   Resp 18   Wt 152 lb 8 oz (69.2 kg)   BMI 22.85 kg/m  Well-developed well-nourished patient in NAD. HEENT reveals PERLA, EOMI, discs not visualized.  Oral cavity is clear. No oral mucosal lesions are identified. Neck is clear without evidence of cervical or supraclavicular adenopathy. Lungs are clear to A&P. Cardiac examination is essentially unremarkable with regular rate and rhythm without murmur rub or thrill. Abdomen is benign with no organomegaly or masses noted. Motor sensory and DTR levels are equal and symmetric in the upper and lower extremities. Cranial nerves II through XII are grossly intact. Proprioception is intact. No peripheral adenopathy or edema is identified. No motor or sensory levels are noted. Crude visual fields are within normal range.  RADIOLOGY RESULTS: CT scan of the chest reviewed  PLAN: Present time patient is doing well under excellent biochemical control of his  prostate cancer at slight uptick over last year although still mild we will see him back in 1 year with a repeat PSA at that time.  Patient knows to call with any concerns at any time.  I would like to take this opportunity to thank you for allowing me to participate in the care of your patient.Noreene Filbert, MD

## 2019-06-21 ENCOUNTER — Ambulatory Visit: Payer: PPO | Attending: Internal Medicine

## 2019-06-21 DIAGNOSIS — Z23 Encounter for immunization: Secondary | ICD-10-CM | POA: Insufficient documentation

## 2019-06-21 NOTE — Progress Notes (Signed)
   Covid-19 Vaccination Clinic  Name:  Mario Hernandez    MRN: [id number] DOB: 1951/01/15  06/21/2019  [name] was observed post Covid-19 immunization for 15 minutes without incidence. He was provided with Vaccine Information Sheet and instruction to access the V-Safe system.   [name] was instructed to call 911 with any severe reactions post vaccine: Marland Kitchen Difficulty breathing  . Swelling of your face and throat  . A fast heartbeat  . A bad rash all over your body  . Dizziness and weakness    Immunizations Administered    Name Date Dose VIS Date Route   Pfizer COVID-19 Vaccine 06/21/2019  1:30 PM 0.3 mL 04/03/2019 Intramuscular   Manufacturer: [place]   Lot: [id number]   [place]: [id number]

## 2019-06-23 DIAGNOSIS — Z8546 Personal history of malignant neoplasm of prostate: Secondary | ICD-10-CM | POA: Diagnosis not present

## 2019-06-23 DIAGNOSIS — Z125 Encounter for screening for malignant neoplasm of prostate: Secondary | ICD-10-CM | POA: Diagnosis not present

## 2019-06-23 DIAGNOSIS — I10 Essential (primary) hypertension: Secondary | ICD-10-CM | POA: Diagnosis not present

## 2019-06-23 DIAGNOSIS — F17200 Nicotine dependence, unspecified, uncomplicated: Secondary | ICD-10-CM | POA: Diagnosis not present

## 2019-06-23 DIAGNOSIS — R911 Solitary pulmonary nodule: Secondary | ICD-10-CM | POA: Diagnosis not present

## 2019-06-30 DIAGNOSIS — C61 Malignant neoplasm of prostate: Secondary | ICD-10-CM | POA: Diagnosis not present

## 2019-06-30 DIAGNOSIS — Z0000 Encounter for general adult medical examination without abnormal findings: Secondary | ICD-10-CM | POA: Diagnosis not present

## 2019-06-30 DIAGNOSIS — F17200 Nicotine dependence, unspecified, uncomplicated: Secondary | ICD-10-CM | POA: Diagnosis not present

## 2019-06-30 DIAGNOSIS — G8929 Other chronic pain: Secondary | ICD-10-CM | POA: Diagnosis not present

## 2019-06-30 DIAGNOSIS — I10 Essential (primary) hypertension: Secondary | ICD-10-CM | POA: Diagnosis not present

## 2019-06-30 DIAGNOSIS — M545 Low back pain: Secondary | ICD-10-CM | POA: Diagnosis not present

## 2019-07-14 ENCOUNTER — Ambulatory Visit: Payer: PPO | Attending: Internal Medicine

## 2019-07-14 DIAGNOSIS — Z23 Encounter for immunization: Secondary | ICD-10-CM

## 2019-07-14 NOTE — Progress Notes (Signed)
   Covid-19 Vaccination Clinic  Name:  Mario Hernandez    MRN: [id number] DOB: Sep 10, 1950  07/14/2019  Mario Hernandez was observed post Covid-19 immunization for 15 minutes without incident. He was provided with Vaccine Information Sheet and instruction to access the V-Safe system.   Mario Hernandez was instructed to call 911 with any severe reactions post vaccine: Marland Kitchen Difficulty breathing  . Swelling of face and throat  . A fast heartbeat  . A bad rash all over body  . Dizziness and weakness   Immunizations Administered    Name Date Dose VIS Date Route   Pfizer COVID-19 Vaccine 07/14/2019 12:27 PM 0.3 mL 04/03/2019 Intramuscular   Manufacturer: [place]   Lot: [id number]   [place]: [id number]

## 2019-12-26 ENCOUNTER — Telehealth: Payer: Self-pay | Admitting: *Deleted

## 2019-12-26 NOTE — Telephone Encounter (Signed)
Left a voicemail to inform patient that his lung cancer screening CT scan is due soon. Instructed him to call back to update information and get his scan scheduled.

## 2019-12-31 DIAGNOSIS — I10 Essential (primary) hypertension: Secondary | ICD-10-CM | POA: Diagnosis not present

## 2019-12-31 DIAGNOSIS — Z0000 Encounter for general adult medical examination without abnormal findings: Secondary | ICD-10-CM | POA: Diagnosis not present

## 2019-12-31 DIAGNOSIS — G8929 Other chronic pain: Secondary | ICD-10-CM | POA: Diagnosis not present

## 2019-12-31 DIAGNOSIS — F17200 Nicotine dependence, unspecified, uncomplicated: Secondary | ICD-10-CM | POA: Diagnosis not present

## 2019-12-31 DIAGNOSIS — M545 Low back pain: Secondary | ICD-10-CM | POA: Diagnosis not present

## 2019-12-31 DIAGNOSIS — C61 Malignant neoplasm of prostate: Secondary | ICD-10-CM | POA: Diagnosis not present

## 2020-01-05 DIAGNOSIS — M545 Low back pain: Secondary | ICD-10-CM | POA: Diagnosis not present

## 2020-01-05 DIAGNOSIS — M25551 Pain in right hip: Secondary | ICD-10-CM | POA: Insufficient documentation

## 2020-01-05 DIAGNOSIS — G8929 Other chronic pain: Secondary | ICD-10-CM | POA: Insufficient documentation

## 2020-01-05 DIAGNOSIS — I10 Essential (primary) hypertension: Secondary | ICD-10-CM | POA: Diagnosis not present

## 2020-01-05 DIAGNOSIS — F17200 Nicotine dependence, unspecified, uncomplicated: Secondary | ICD-10-CM | POA: Diagnosis not present

## 2020-01-05 DIAGNOSIS — C61 Malignant neoplasm of prostate: Secondary | ICD-10-CM | POA: Diagnosis not present

## 2020-01-05 DIAGNOSIS — I700 Atherosclerosis of aorta: Secondary | ICD-10-CM | POA: Diagnosis not present

## 2020-01-21 ENCOUNTER — Other Ambulatory Visit: Payer: Self-pay

## 2020-01-21 ENCOUNTER — Inpatient Hospital Stay: Payer: PPO | Attending: Radiation Oncology

## 2020-01-21 DIAGNOSIS — Z923 Personal history of irradiation: Secondary | ICD-10-CM | POA: Diagnosis not present

## 2020-01-21 DIAGNOSIS — C61 Malignant neoplasm of prostate: Secondary | ICD-10-CM | POA: Insufficient documentation

## 2020-01-21 LAB — PSA: Prostatic Specific Antigen: 0.7 ng/mL (ref 0.00–4.00)

## 2020-01-28 ENCOUNTER — Encounter: Payer: Self-pay | Admitting: Radiation Oncology

## 2020-01-28 ENCOUNTER — Other Ambulatory Visit: Payer: Self-pay

## 2020-01-28 ENCOUNTER — Ambulatory Visit
Admission: RE | Admit: 2020-01-28 | Discharge: 2020-01-28 | Disposition: A | Discharge status: Home / Self Care | Payer: PPO | Source: Ambulatory Visit | Attending: Radiation Oncology | Admitting: Radiation Oncology

## 2020-01-28 ENCOUNTER — Telehealth: Payer: Self-pay | Admitting: *Deleted

## 2020-01-28 DIAGNOSIS — Z923 Personal history of irradiation: Secondary | ICD-10-CM | POA: Diagnosis not present

## 2020-01-28 DIAGNOSIS — C61 Malignant neoplasm of prostate: Secondary | ICD-10-CM | POA: Diagnosis not present

## 2020-01-28 NOTE — Progress Notes (Signed)
Radiation Oncology Follow up Note  Name: Mario Hernandez   Date:   01/28/2020 MRN:  [id number] DOB: 01/26/1951    This 69 y.o. male presents to the clinic today for 4-1/2-year follow-up status post IMRT radiation therapy for Gleason 7 (4+3) adenocarcinoma the prostate.  REFERRING PROVIDER: Tracie Harrier, MD  HPI: Patient is a 69 year old male now at 4-1/2 years having completed IMRT radiation therapy for Gleason 7 adenocarcinoma the prostate.  His original PSA was 12.3.  He is seen today in routine follow-up and is doing well specifically denies any increased lower urinary tract symptoms diarrhea or fatigue.  His most recent PSA is 0.7.  COMPLICATIONS OF TREATMENT: none  FOLLOW UP COMPLIANCE: keeps appointments   PHYSICAL EXAM:  BP (!) (P) 146/88 (BP Location: Left Arm, Patient Position: Sitting)    Pulse (P) 83    Temp (!) (P) 96.9 F (36.1 C) (Tympanic)    Resp (P) 16    Wt (P) 150 lb 4.8 oz (68.2 kg)    BMI (P) 22.52 kg/m  Well-developed well-nourished patient in NAD. HEENT reveals PERLA, EOMI, discs not visualized.  Oral cavity is clear. No oral mucosal lesions are identified. Neck is clear without evidence of cervical or supraclavicular adenopathy. Lungs are clear to A&P. Cardiac examination is essentially unremarkable with regular rate and rhythm without murmur rub or thrill. Abdomen is benign with no organomegaly or masses noted. Motor sensory and DTR levels are equal and symmetric in the upper and lower extremities. Cranial nerves II through XII are grossly intact. Proprioception is intact. No peripheral adenopathy or edema is identified. No motor or sensory levels are noted. Crude visual fields are within normal range.  RADIOLOGY RESULTS: No current films to review  PLAN: Present time patient now is out close to 5 years with excellent biochemical control of his prostate cancer.  I am pleased with his overall progress.  I I will turn follow-up care over to his family practice  doctor.  I have asked him to run PSAs yearly.  We will be happy to reevaluate her consult the patient again should that be indicated.  Patient knows to call with any concerns.  I would like to take this opportunity to thank you for allowing me to participate in the care of your patient.Noreene Filbert, MD

## 2020-01-28 NOTE — Telephone Encounter (Signed)
Attempted to contact and schedule lung screening scan. Message left for patient to call back to schedule. 

## 2020-03-03 ENCOUNTER — Telehealth: Payer: Self-pay

## 2020-03-03 NOTE — Telephone Encounter (Signed)
Attempted to contact patient to schedule lung screening scan but unable to reach him. Left message for pt to return call back to schedule CT scan. Return call phone number was provided in message.

## 2020-04-27 ENCOUNTER — Encounter: Payer: Self-pay | Admitting: *Deleted

## 2020-05-13 ENCOUNTER — Other Ambulatory Visit: Payer: Self-pay | Admitting: *Deleted

## 2020-05-13 DIAGNOSIS — Z122 Encounter for screening for malignant neoplasm of respiratory organs: Secondary | ICD-10-CM

## 2020-05-13 DIAGNOSIS — Z87891 Personal history of nicotine dependence: Secondary | ICD-10-CM

## 2020-05-13 NOTE — Progress Notes (Signed)
Contacted and scheduled for annual lung screening scan. Patient is a current smoker with a 51 pack year history.  

## 2020-05-26 ENCOUNTER — Ambulatory Visit
Admission: RE | Admit: 2020-05-26 | Discharge: 2020-05-26 | Disposition: A | Discharge status: Home / Self Care | Payer: PPO | Source: Ambulatory Visit | Attending: Nurse Practitioner | Admitting: Nurse Practitioner

## 2020-05-26 ENCOUNTER — Other Ambulatory Visit: Payer: Self-pay

## 2020-05-26 DIAGNOSIS — Z87891 Personal history of nicotine dependence: Secondary | ICD-10-CM | POA: Insufficient documentation

## 2020-05-26 DIAGNOSIS — F17210 Nicotine dependence, cigarettes, uncomplicated: Secondary | ICD-10-CM | POA: Diagnosis not present

## 2020-05-26 DIAGNOSIS — Z122 Encounter for screening for malignant neoplasm of respiratory organs: Secondary | ICD-10-CM

## 2020-05-27 ENCOUNTER — Telehealth: Payer: Self-pay | Admitting: *Deleted

## 2020-05-27 NOTE — Telephone Encounter (Signed)
After review of lung cancer screening results from CT yesterday, with Dr. Lanney Gins and recommendation for PET scan with bronchoscopic evaluation if positive PET results, Dr. Linton Ham nurse notified and attempted to contact patient to review results and schedule PET scan. No prior Josem Kaufmann is required for PET scan. Left voicemail for patient to return my call to review results. Will follow up.  IMPRESSION: 1. Lung-RADS 4B, suspicious. Additional imaging evaluation or consultation with Pulmonology or Thoracic Surgery recommended. 2. Aortic Atherosclerosis (ICD10-I70.0) and Emphysema (ICD10-J43.9). 3. Coronary artery calcifications noted.

## 2020-05-30 ENCOUNTER — Telehealth: Payer: Self-pay | Admitting: *Deleted

## 2020-05-30 NOTE — Telephone Encounter (Signed)
Left voicemail today 05/30/20 in attempt to review lung screening scan results and schedule PET scan. Requested patient to return my call.

## 2020-05-30 NOTE — Telephone Encounter (Signed)
Patient returned message and reviewed lung screening results noted below with recommendation for PET scan as a "next step". Patient request PET be delayed until the 1st week of March. Reviewed possible implications of delaying diagnosis of a lung cancer and patient verbalizes understanding. He is adamant that he will wait until March for PET scan. Will call patient tomorrow with PET scan appt.   IMPRESSION: 1. Lung-RADS 4B, suspicious. Additional imaging evaluation or consultation with Pulmonology or Thoracic Surgery recommended. 2. Aortic Atherosclerosis (ICD10-I70.0) and Emphysema (ICD10-J43.9). 3. Coronary artery calcifications noted.

## 2020-05-31 ENCOUNTER — Other Ambulatory Visit: Payer: Self-pay | Admitting: *Deleted

## 2020-05-31 DIAGNOSIS — R911 Solitary pulmonary nodule: Secondary | ICD-10-CM

## 2020-05-31 NOTE — Progress Notes (Addendum)
Pet scheduled for 06/23/20 at 1230pm with a 12 noon arrival. Message left on patient's voicemail per his request with day/time and location of arrival. Delay in scheduling is due to patient's request.

## 2020-06-23 ENCOUNTER — Ambulatory Visit
Admission: RE | Admit: 2020-06-23 | Discharge: 2020-06-23 | Disposition: A | Discharge status: Home / Self Care | Payer: PPO | Source: Ambulatory Visit | Attending: Nurse Practitioner | Admitting: Nurse Practitioner

## 2020-06-23 ENCOUNTER — Other Ambulatory Visit: Payer: Self-pay

## 2020-06-23 DIAGNOSIS — R911 Solitary pulmonary nodule: Secondary | ICD-10-CM

## 2020-06-23 LAB — GLUCOSE, CAPILLARY: Glucose-Capillary: 74 mg/dL (ref 70–99)

## 2020-06-23 MED ORDER — FLUDEOXYGLUCOSE F - 18 (FDG) INJECTION
7.8000 | Freq: Once | INTRAVENOUS | Status: AC | PRN
  Administered 2020-06-23: 8.23 via INTRAVENOUS

## 2020-06-24 DIAGNOSIS — R911 Solitary pulmonary nodule: Secondary | ICD-10-CM | POA: Diagnosis not present

## 2020-06-28 ENCOUNTER — Telehealth: Payer: Self-pay | Admitting: *Deleted

## 2020-06-28 NOTE — Telephone Encounter (Signed)
After obtaining pulmonary opinion from Dr. Lanney Gins for next lung screening CT scan to be in 12 months, patient contacted and reviewed PET scan results. Patient verbalizes understanding.   IMPRESSION: 1. Superior segment left lower lobe pulmonary lesion does not demonstrate any hypermetabolism and is likely an area of scar. Recommend continued CT surveillance. 2. 11.5 mm ground-glass nodule in the right upper lobe does not demonstrate any hypermetabolism. Recommend continued CT surveillance. 3. Stable emphysematous changes and advanced

## 2020-07-04 NOTE — Progress Notes (Signed)
Capillary glucose required prior to pet scan which is being done to workup lung nodule.

## 2020-07-07 DIAGNOSIS — F17200 Nicotine dependence, unspecified, uncomplicated: Secondary | ICD-10-CM | POA: Diagnosis not present

## 2020-07-07 DIAGNOSIS — G8929 Other chronic pain: Secondary | ICD-10-CM | POA: Diagnosis not present

## 2020-07-07 DIAGNOSIS — M25551 Pain in right hip: Secondary | ICD-10-CM | POA: Diagnosis not present

## 2020-07-07 DIAGNOSIS — Z0000 Encounter for general adult medical examination without abnormal findings: Secondary | ICD-10-CM | POA: Diagnosis not present

## 2020-07-07 DIAGNOSIS — Z23 Encounter for immunization: Secondary | ICD-10-CM | POA: Diagnosis not present

## 2020-07-07 DIAGNOSIS — I10 Essential (primary) hypertension: Secondary | ICD-10-CM | POA: Diagnosis not present

## 2021-01-02 DIAGNOSIS — Z125 Encounter for screening for malignant neoplasm of prostate: Secondary | ICD-10-CM | POA: Diagnosis not present

## 2021-01-02 DIAGNOSIS — Z0000 Encounter for general adult medical examination without abnormal findings: Secondary | ICD-10-CM | POA: Diagnosis not present

## 2021-01-02 DIAGNOSIS — G8929 Other chronic pain: Secondary | ICD-10-CM | POA: Diagnosis not present

## 2021-01-02 DIAGNOSIS — Z23 Encounter for immunization: Secondary | ICD-10-CM | POA: Diagnosis not present

## 2021-01-02 DIAGNOSIS — R8290 Unspecified abnormal findings in urine: Secondary | ICD-10-CM | POA: Diagnosis not present

## 2021-01-02 DIAGNOSIS — I10 Essential (primary) hypertension: Secondary | ICD-10-CM | POA: Diagnosis not present

## 2021-01-02 DIAGNOSIS — F17200 Nicotine dependence, unspecified, uncomplicated: Secondary | ICD-10-CM | POA: Diagnosis not present

## 2021-01-02 DIAGNOSIS — M25551 Pain in right hip: Secondary | ICD-10-CM | POA: Diagnosis not present

## 2021-01-09 DIAGNOSIS — I10 Essential (primary) hypertension: Secondary | ICD-10-CM | POA: Diagnosis not present

## 2021-01-09 DIAGNOSIS — G8929 Other chronic pain: Secondary | ICD-10-CM | POA: Diagnosis not present

## 2021-01-09 DIAGNOSIS — Z9181 History of falling: Secondary | ICD-10-CM | POA: Diagnosis not present

## 2021-01-09 DIAGNOSIS — E875 Hyperkalemia: Secondary | ICD-10-CM | POA: Diagnosis not present

## 2021-01-09 DIAGNOSIS — Z1211 Encounter for screening for malignant neoplasm of colon: Secondary | ICD-10-CM | POA: Diagnosis not present

## 2021-01-09 DIAGNOSIS — M25551 Pain in right hip: Secondary | ICD-10-CM | POA: Diagnosis not present

## 2021-01-09 DIAGNOSIS — I700 Atherosclerosis of aorta: Secondary | ICD-10-CM | POA: Insufficient documentation

## 2021-01-09 DIAGNOSIS — F17200 Nicotine dependence, unspecified, uncomplicated: Secondary | ICD-10-CM | POA: Diagnosis not present

## 2021-01-09 DIAGNOSIS — C61 Malignant neoplasm of prostate: Secondary | ICD-10-CM | POA: Diagnosis not present

## 2021-01-09 DIAGNOSIS — M25512 Pain in left shoulder: Secondary | ICD-10-CM | POA: Diagnosis not present

## 2021-01-09 DIAGNOSIS — J432 Centrilobular emphysema: Secondary | ICD-10-CM | POA: Insufficient documentation

## 2021-01-18 DIAGNOSIS — Z1211 Encounter for screening for malignant neoplasm of colon: Secondary | ICD-10-CM | POA: Diagnosis not present

## 2021-01-24 LAB — COLOGUARD: COLOGUARD: NEGATIVE

## 2021-01-24 LAB — EXTERNAL GENERIC LAB PROCEDURE: COLOGUARD: NEGATIVE

## 2021-03-15 IMAGING — CT CT CHEST LUNG CANCER SCREENING LOW DOSE W/O CM
2 of 5 series · 15 of 40 positions shown, 18 images · non-contrast
Comparison: [DATE]

CLINICAL DATA: Current smoker. Fifty-one pack-year history.
Asymptomatic.

EXAM:
CT CHEST WITHOUT CONTRAST LOW-DOSE FOR LUNG CANCER SCREENING
TECHNIQUE: Multidetector CT imaging of the chest was performed following the
standard protocol without IV contrast.

[Series 3: lung 1.00 · axial · 0.66mm/px · z∈[-1263,-943]mm · 12 of 352 slices shown, 15 images]
[im 16/352  mediastinal]
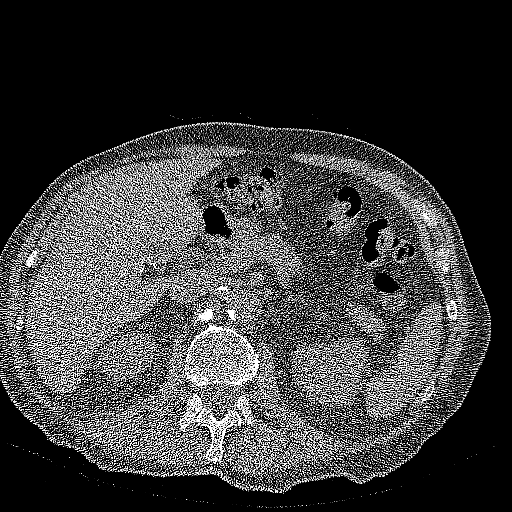
[im 16/352  lung]
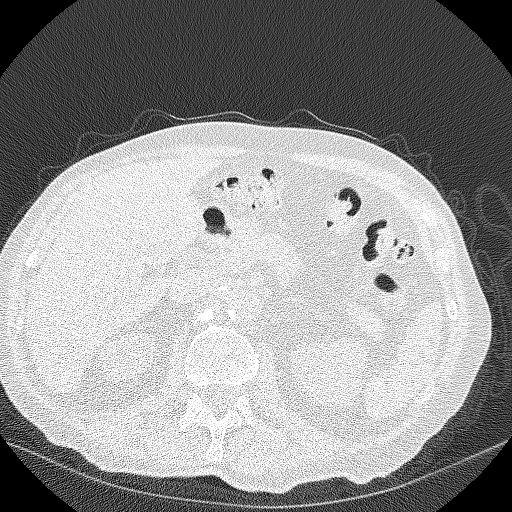
[im 48/352  lung]
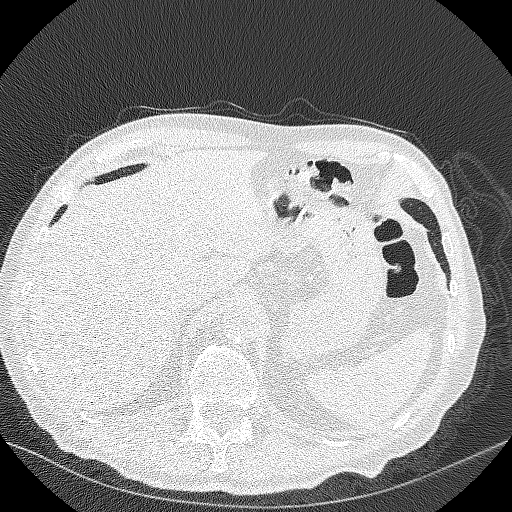
[im 80/352  lung]
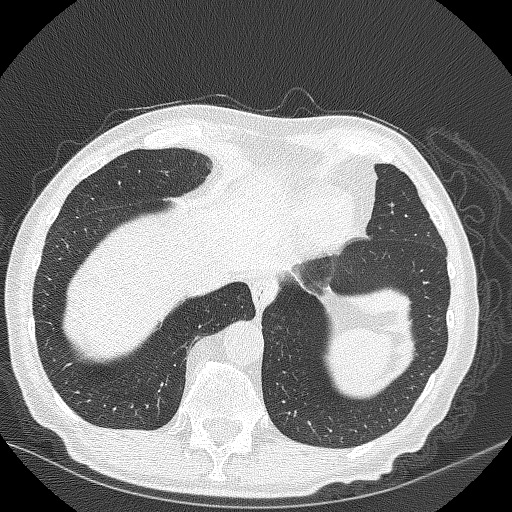
[im 112/352  lung]
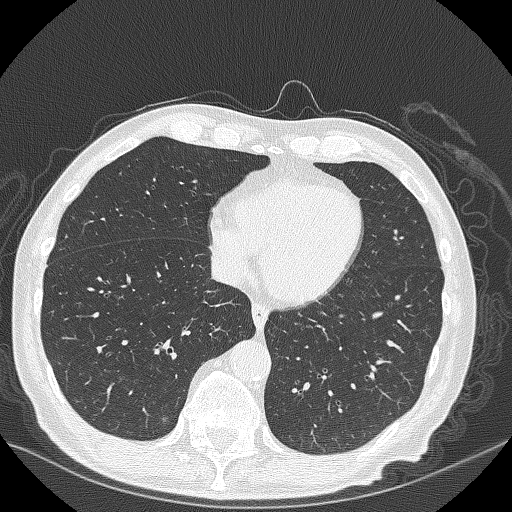
[im 128/352  mediastinal]
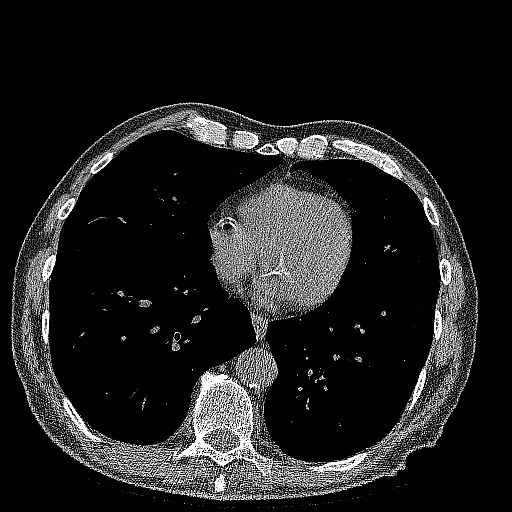
[im 128/352  lung]
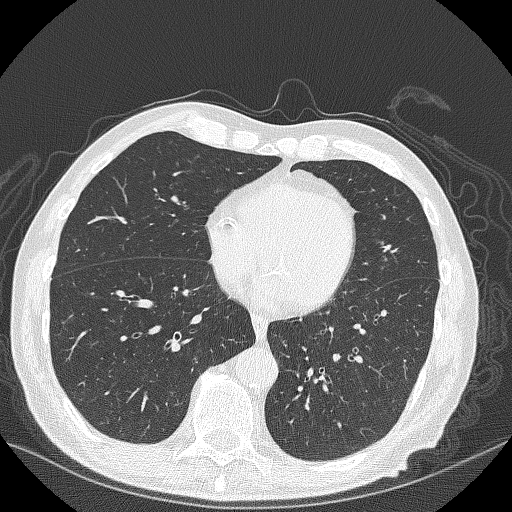
[im 160/352  lung]
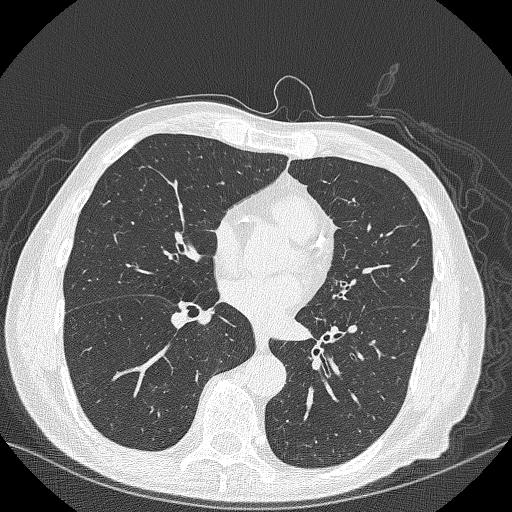
[im 192/352  lung]
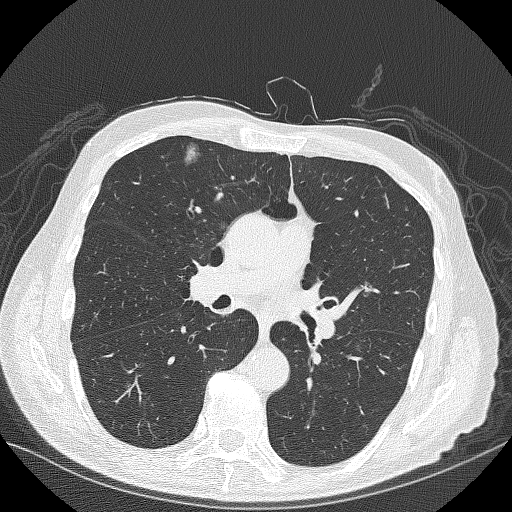
[im 224/352  lung]
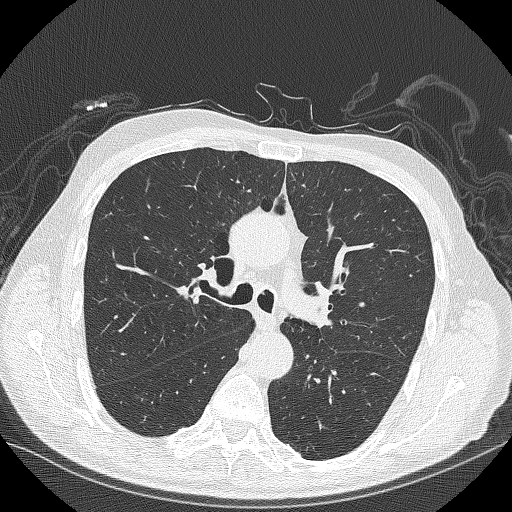
[im 240/352  mediastinal]
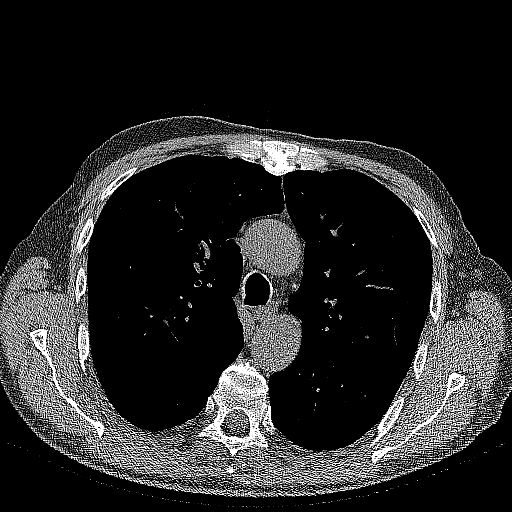
[im 240/352  lung]
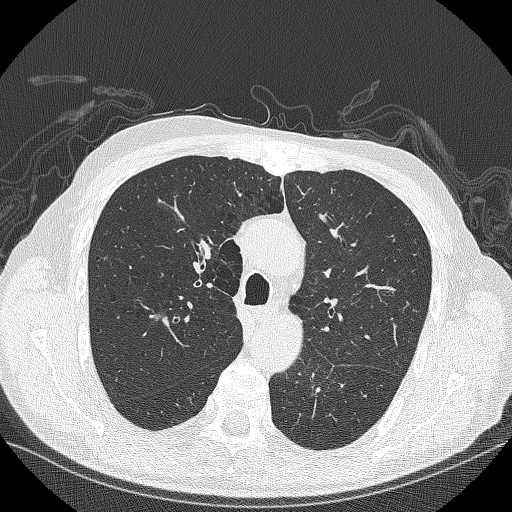
[im 272/352  lung]
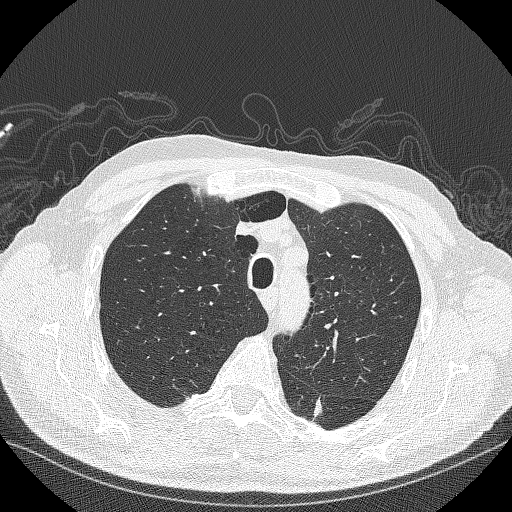
[im 304/352  lung]
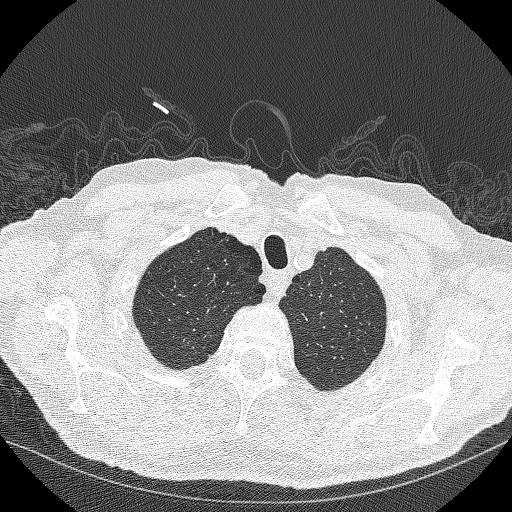
[im 336/352  lung]
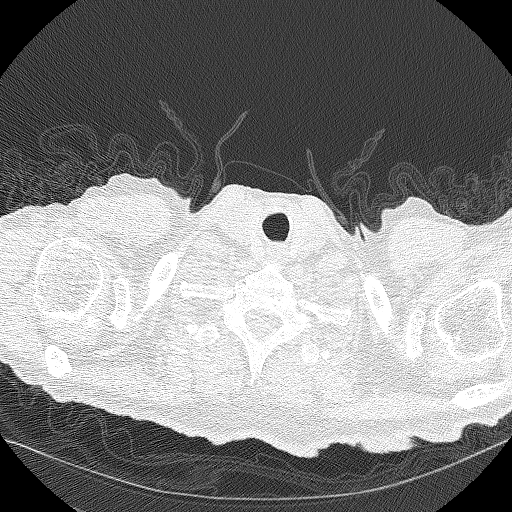

[Series 4: coronals lung 1.00 cor · coronal · 0.66mm/px · 3 of 338 slices shown]
[im 68/338  lung]
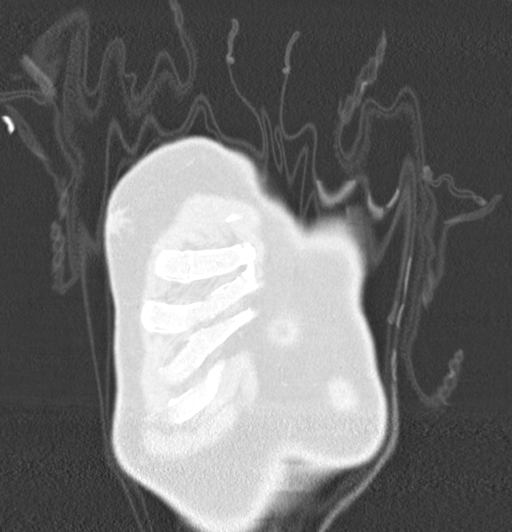
[im 135/338  lung]
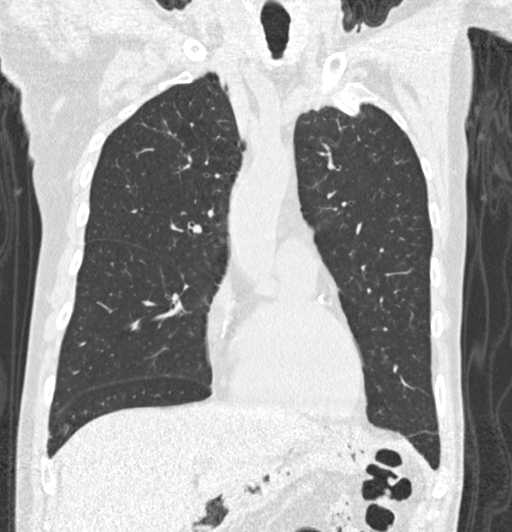
[im 203/338  lung]
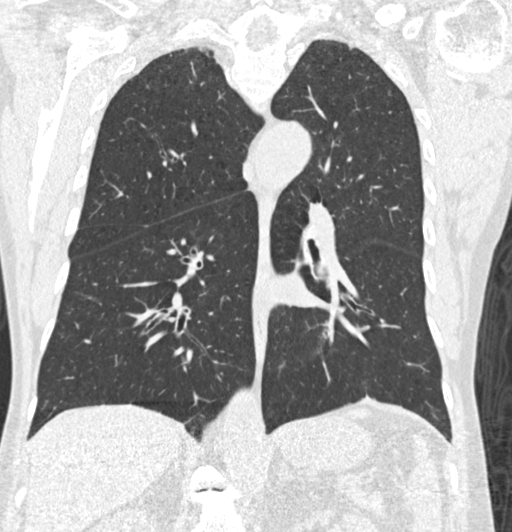

[15 of 40 positions shown; findings below may reference images not displayed]

FINDINGS: Cardiovascular: Heart size appears within normal limits. Aortic
atherosclerosis identified. Coronary artery atherosclerotic
calcifications.

Mediastinum/Nodes: Aortic atherosclerosis. Coronary artery
atherosclerotic calcifications. No pericardial effusion identified.
No enlarged mediastinal or hilar lymph nodes. No axillary or
supraclavicular adenopathy.

Lungs/Pleura: Paraseptal and centrilobular emphysema. Previously
noted solid and non solid lung nodules appear unchanged from
previous exam. There is a new spiculated nodule within the superior
segment of left lower lobe abutting the oblique fissure. This
measures 14.2 mm, image 97/3. Adjacent, smaller nodule measures
mm, image 73/3.

Upper Abdomen: No acute findings within the abdomen or pelvis. Small
cysts within the caudate and posterior right hepatic lobes appear
unchanged.

Musculoskeletal: No chest wall mass or suspicious bone lesions
identified.
IMPRESSION: 1. Lung-RADS 4B, suspicious. Additional imaging evaluation or
consultation with Pulmonology or Thoracic Surgery recommended.
2. Aortic Atherosclerosis (RTFMG-2SH.H) and Emphysema (RTFMG-87M.5).
3. Coronary artery calcifications noted.

These results will be called to the ordering clinician or
representative by the Radiologist Assistant, and communication
documented in the PACS or [REDACTED].

## 2021-07-06 DIAGNOSIS — E875 Hyperkalemia: Secondary | ICD-10-CM | POA: Diagnosis not present

## 2021-07-06 DIAGNOSIS — Z9181 History of falling: Secondary | ICD-10-CM | POA: Diagnosis not present

## 2021-07-06 DIAGNOSIS — I10 Essential (primary) hypertension: Secondary | ICD-10-CM | POA: Diagnosis not present

## 2021-07-06 DIAGNOSIS — R8290 Unspecified abnormal findings in urine: Secondary | ICD-10-CM | POA: Diagnosis not present

## 2021-07-06 DIAGNOSIS — G8929 Other chronic pain: Secondary | ICD-10-CM | POA: Diagnosis not present

## 2021-07-06 DIAGNOSIS — F17200 Nicotine dependence, unspecified, uncomplicated: Secondary | ICD-10-CM | POA: Diagnosis not present

## 2021-07-06 DIAGNOSIS — Z1211 Encounter for screening for malignant neoplasm of colon: Secondary | ICD-10-CM | POA: Diagnosis not present

## 2021-07-06 DIAGNOSIS — M25512 Pain in left shoulder: Secondary | ICD-10-CM | POA: Diagnosis not present

## 2021-07-06 DIAGNOSIS — C61 Malignant neoplasm of prostate: Secondary | ICD-10-CM | POA: Diagnosis not present

## 2021-07-06 DIAGNOSIS — M25551 Pain in right hip: Secondary | ICD-10-CM | POA: Diagnosis not present

## 2021-07-13 DIAGNOSIS — C801 Malignant (primary) neoplasm, unspecified: Secondary | ICD-10-CM | POA: Diagnosis not present

## 2021-07-13 DIAGNOSIS — M545 Low back pain, unspecified: Secondary | ICD-10-CM | POA: Diagnosis not present

## 2021-07-13 DIAGNOSIS — M25551 Pain in right hip: Secondary | ICD-10-CM | POA: Diagnosis not present

## 2021-07-13 DIAGNOSIS — Z0000 Encounter for general adult medical examination without abnormal findings: Secondary | ICD-10-CM | POA: Diagnosis not present

## 2021-07-13 DIAGNOSIS — G8929 Other chronic pain: Secondary | ICD-10-CM | POA: Diagnosis not present

## 2021-07-13 DIAGNOSIS — I700 Atherosclerosis of aorta: Secondary | ICD-10-CM | POA: Diagnosis not present

## 2021-07-13 DIAGNOSIS — J432 Centrilobular emphysema: Secondary | ICD-10-CM | POA: Diagnosis not present

## 2021-07-13 DIAGNOSIS — I10 Essential (primary) hypertension: Secondary | ICD-10-CM | POA: Diagnosis not present

## 2021-07-13 DIAGNOSIS — F17210 Nicotine dependence, cigarettes, uncomplicated: Secondary | ICD-10-CM | POA: Diagnosis not present

## 2022-01-09 DIAGNOSIS — Z125 Encounter for screening for malignant neoplasm of prostate: Secondary | ICD-10-CM | POA: Diagnosis not present

## 2022-01-09 DIAGNOSIS — G8929 Other chronic pain: Secondary | ICD-10-CM | POA: Diagnosis not present

## 2022-01-09 DIAGNOSIS — I10 Essential (primary) hypertension: Secondary | ICD-10-CM | POA: Diagnosis not present

## 2022-01-09 DIAGNOSIS — M545 Low back pain, unspecified: Secondary | ICD-10-CM | POA: Diagnosis not present

## 2022-01-09 DIAGNOSIS — R7309 Other abnormal glucose: Secondary | ICD-10-CM | POA: Diagnosis not present

## 2022-01-09 DIAGNOSIS — F17200 Nicotine dependence, unspecified, uncomplicated: Secondary | ICD-10-CM | POA: Diagnosis not present

## 2022-01-09 DIAGNOSIS — C801 Malignant (primary) neoplasm, unspecified: Secondary | ICD-10-CM | POA: Diagnosis not present

## 2022-01-09 DIAGNOSIS — I700 Atherosclerosis of aorta: Secondary | ICD-10-CM | POA: Diagnosis not present

## 2022-01-09 DIAGNOSIS — M25551 Pain in right hip: Secondary | ICD-10-CM | POA: Diagnosis not present

## 2022-01-16 DIAGNOSIS — R972 Elevated prostate specific antigen [PSA]: Secondary | ICD-10-CM | POA: Diagnosis not present

## 2022-01-16 DIAGNOSIS — I700 Atherosclerosis of aorta: Secondary | ICD-10-CM | POA: Diagnosis not present

## 2022-01-16 DIAGNOSIS — G8929 Other chronic pain: Secondary | ICD-10-CM | POA: Diagnosis not present

## 2022-01-16 DIAGNOSIS — C61 Malignant neoplasm of prostate: Secondary | ICD-10-CM | POA: Diagnosis not present

## 2022-01-16 DIAGNOSIS — J432 Centrilobular emphysema: Secondary | ICD-10-CM | POA: Diagnosis not present

## 2022-01-16 DIAGNOSIS — R7309 Other abnormal glucose: Secondary | ICD-10-CM | POA: Diagnosis not present

## 2022-01-16 DIAGNOSIS — M25551 Pain in right hip: Secondary | ICD-10-CM | POA: Diagnosis not present

## 2022-01-16 DIAGNOSIS — I10 Essential (primary) hypertension: Secondary | ICD-10-CM | POA: Diagnosis not present

## 2022-01-16 DIAGNOSIS — F17210 Nicotine dependence, cigarettes, uncomplicated: Secondary | ICD-10-CM | POA: Diagnosis not present

## 2022-01-16 DIAGNOSIS — F1090 Alcohol use, unspecified, uncomplicated: Secondary | ICD-10-CM | POA: Diagnosis not present

## 2022-01-16 DIAGNOSIS — Z0000 Encounter for general adult medical examination without abnormal findings: Secondary | ICD-10-CM | POA: Diagnosis not present

## 2022-07-10 DIAGNOSIS — Z0000 Encounter for general adult medical examination without abnormal findings: Secondary | ICD-10-CM | POA: Diagnosis not present

## 2022-07-10 DIAGNOSIS — R972 Elevated prostate specific antigen [PSA]: Secondary | ICD-10-CM | POA: Diagnosis not present

## 2022-07-10 DIAGNOSIS — Z720 Tobacco use: Secondary | ICD-10-CM | POA: Diagnosis not present

## 2022-07-10 DIAGNOSIS — R7309 Other abnormal glucose: Secondary | ICD-10-CM | POA: Diagnosis not present

## 2022-07-10 DIAGNOSIS — F1090 Alcohol use, unspecified, uncomplicated: Secondary | ICD-10-CM | POA: Diagnosis not present

## 2022-07-10 DIAGNOSIS — M25551 Pain in right hip: Secondary | ICD-10-CM | POA: Diagnosis not present

## 2022-07-10 DIAGNOSIS — J432 Centrilobular emphysema: Secondary | ICD-10-CM | POA: Diagnosis not present

## 2022-07-10 DIAGNOSIS — C61 Malignant neoplasm of prostate: Secondary | ICD-10-CM | POA: Diagnosis not present

## 2022-07-10 DIAGNOSIS — G8929 Other chronic pain: Secondary | ICD-10-CM | POA: Diagnosis not present

## 2022-07-10 DIAGNOSIS — I10 Essential (primary) hypertension: Secondary | ICD-10-CM | POA: Diagnosis not present

## 2022-07-10 DIAGNOSIS — I700 Atherosclerosis of aorta: Secondary | ICD-10-CM | POA: Diagnosis not present

## 2022-07-17 DIAGNOSIS — M25551 Pain in right hip: Secondary | ICD-10-CM | POA: Diagnosis not present

## 2022-07-17 DIAGNOSIS — Z0000 Encounter for general adult medical examination without abnormal findings: Secondary | ICD-10-CM | POA: Diagnosis not present

## 2022-07-17 DIAGNOSIS — I700 Atherosclerosis of aorta: Secondary | ICD-10-CM | POA: Diagnosis not present

## 2022-07-17 DIAGNOSIS — M545 Low back pain, unspecified: Secondary | ICD-10-CM | POA: Diagnosis not present

## 2022-07-17 DIAGNOSIS — R972 Elevated prostate specific antigen [PSA]: Secondary | ICD-10-CM | POA: Diagnosis not present

## 2022-07-17 DIAGNOSIS — M25512 Pain in left shoulder: Secondary | ICD-10-CM | POA: Diagnosis not present

## 2022-07-17 DIAGNOSIS — C801 Malignant (primary) neoplasm, unspecified: Secondary | ICD-10-CM | POA: Diagnosis not present

## 2022-07-17 DIAGNOSIS — G8929 Other chronic pain: Secondary | ICD-10-CM | POA: Diagnosis not present

## 2022-07-17 DIAGNOSIS — F17210 Nicotine dependence, cigarettes, uncomplicated: Secondary | ICD-10-CM | POA: Diagnosis not present

## 2022-07-17 DIAGNOSIS — J432 Centrilobular emphysema: Secondary | ICD-10-CM | POA: Diagnosis not present

## 2022-07-17 DIAGNOSIS — I10 Essential (primary) hypertension: Secondary | ICD-10-CM | POA: Diagnosis not present

## 2022-09-04 DIAGNOSIS — L089 Local infection of the skin and subcutaneous tissue, unspecified: Secondary | ICD-10-CM | POA: Diagnosis not present

## 2022-09-04 DIAGNOSIS — I10 Essential (primary) hypertension: Secondary | ICD-10-CM | POA: Diagnosis not present

## 2022-09-04 DIAGNOSIS — L723 Sebaceous cyst: Secondary | ICD-10-CM | POA: Diagnosis not present

## 2022-09-04 DIAGNOSIS — F17200 Nicotine dependence, unspecified, uncomplicated: Secondary | ICD-10-CM | POA: Diagnosis not present

## 2022-09-04 DIAGNOSIS — G8929 Other chronic pain: Secondary | ICD-10-CM | POA: Diagnosis not present

## 2022-09-04 DIAGNOSIS — M25551 Pain in right hip: Secondary | ICD-10-CM | POA: Diagnosis not present

## 2022-09-10 DIAGNOSIS — F17200 Nicotine dependence, unspecified, uncomplicated: Secondary | ICD-10-CM | POA: Diagnosis not present

## 2022-09-10 DIAGNOSIS — L720 Epidermal cyst: Secondary | ICD-10-CM | POA: Diagnosis not present

## 2023-01-15 DIAGNOSIS — R972 Elevated prostate specific antigen [PSA]: Secondary | ICD-10-CM | POA: Diagnosis not present

## 2023-01-15 DIAGNOSIS — I10 Essential (primary) hypertension: Secondary | ICD-10-CM | POA: Diagnosis not present

## 2023-01-15 DIAGNOSIS — I700 Atherosclerosis of aorta: Secondary | ICD-10-CM | POA: Diagnosis not present

## 2023-01-15 DIAGNOSIS — J432 Centrilobular emphysema: Secondary | ICD-10-CM | POA: Diagnosis not present

## 2023-01-15 DIAGNOSIS — M545 Low back pain, unspecified: Secondary | ICD-10-CM | POA: Diagnosis not present

## 2023-01-15 DIAGNOSIS — R8290 Unspecified abnormal findings in urine: Secondary | ICD-10-CM | POA: Diagnosis not present

## 2023-01-15 DIAGNOSIS — M25512 Pain in left shoulder: Secondary | ICD-10-CM | POA: Diagnosis not present

## 2023-01-15 DIAGNOSIS — R7309 Other abnormal glucose: Secondary | ICD-10-CM | POA: Diagnosis not present

## 2023-01-15 DIAGNOSIS — M25551 Pain in right hip: Secondary | ICD-10-CM | POA: Diagnosis not present

## 2023-01-15 DIAGNOSIS — G8929 Other chronic pain: Secondary | ICD-10-CM | POA: Diagnosis not present

## 2023-01-22 DIAGNOSIS — F17210 Nicotine dependence, cigarettes, uncomplicated: Secondary | ICD-10-CM | POA: Diagnosis not present

## 2023-01-22 DIAGNOSIS — I10 Essential (primary) hypertension: Secondary | ICD-10-CM | POA: Diagnosis not present

## 2023-01-22 DIAGNOSIS — I700 Atherosclerosis of aorta: Secondary | ICD-10-CM | POA: Diagnosis not present

## 2023-01-22 DIAGNOSIS — M778 Other enthesopathies, not elsewhere classified: Secondary | ICD-10-CM | POA: Diagnosis not present

## 2023-01-22 DIAGNOSIS — R972 Elevated prostate specific antigen [PSA]: Secondary | ICD-10-CM | POA: Diagnosis not present

## 2023-01-22 DIAGNOSIS — B952 Enterococcus as the cause of diseases classified elsewhere: Secondary | ICD-10-CM | POA: Diagnosis not present

## 2023-01-22 DIAGNOSIS — N390 Urinary tract infection, site not specified: Secondary | ICD-10-CM | POA: Diagnosis not present

## 2023-01-22 DIAGNOSIS — Z8546 Personal history of malignant neoplasm of prostate: Secondary | ICD-10-CM | POA: Diagnosis not present

## 2023-01-22 DIAGNOSIS — R7309 Other abnormal glucose: Secondary | ICD-10-CM | POA: Insufficient documentation

## 2023-01-22 DIAGNOSIS — Z0000 Encounter for general adult medical examination without abnormal findings: Secondary | ICD-10-CM | POA: Diagnosis not present

## 2023-01-22 DIAGNOSIS — J432 Centrilobular emphysema: Secondary | ICD-10-CM | POA: Diagnosis not present

## 2023-01-22 DIAGNOSIS — Z2821 Immunization not carried out because of patient refusal: Secondary | ICD-10-CM | POA: Diagnosis not present

## 2023-07-16 DIAGNOSIS — R972 Elevated prostate specific antigen [PSA]: Secondary | ICD-10-CM | POA: Diagnosis not present

## 2023-07-16 DIAGNOSIS — Z8546 Personal history of malignant neoplasm of prostate: Secondary | ICD-10-CM | POA: Diagnosis not present

## 2023-07-16 DIAGNOSIS — I700 Atherosclerosis of aorta: Secondary | ICD-10-CM | POA: Diagnosis not present

## 2023-07-16 DIAGNOSIS — J432 Centrilobular emphysema: Secondary | ICD-10-CM | POA: Diagnosis not present

## 2023-07-16 DIAGNOSIS — R7309 Other abnormal glucose: Secondary | ICD-10-CM | POA: Diagnosis not present

## 2023-07-16 DIAGNOSIS — M778 Other enthesopathies, not elsewhere classified: Secondary | ICD-10-CM | POA: Diagnosis not present

## 2023-07-16 DIAGNOSIS — I10 Essential (primary) hypertension: Secondary | ICD-10-CM | POA: Diagnosis not present

## 2023-07-16 DIAGNOSIS — Z0000 Encounter for general adult medical examination without abnormal findings: Secondary | ICD-10-CM | POA: Diagnosis not present

## 2023-07-16 DIAGNOSIS — Z125 Encounter for screening for malignant neoplasm of prostate: Secondary | ICD-10-CM | POA: Diagnosis not present

## 2023-07-16 DIAGNOSIS — F17200 Nicotine dependence, unspecified, uncomplicated: Secondary | ICD-10-CM | POA: Diagnosis not present

## 2023-07-23 DIAGNOSIS — I10 Essential (primary) hypertension: Secondary | ICD-10-CM | POA: Diagnosis not present

## 2023-07-23 DIAGNOSIS — I700 Atherosclerosis of aorta: Secondary | ICD-10-CM | POA: Diagnosis not present

## 2023-07-23 DIAGNOSIS — Z8546 Personal history of malignant neoplasm of prostate: Secondary | ICD-10-CM | POA: Diagnosis not present

## 2023-07-23 DIAGNOSIS — C801 Malignant (primary) neoplasm, unspecified: Secondary | ICD-10-CM | POA: Diagnosis not present

## 2023-07-23 DIAGNOSIS — M25512 Pain in left shoulder: Secondary | ICD-10-CM | POA: Diagnosis not present

## 2023-07-23 DIAGNOSIS — M25551 Pain in right hip: Secondary | ICD-10-CM | POA: Diagnosis not present

## 2023-07-23 DIAGNOSIS — R7309 Other abnormal glucose: Secondary | ICD-10-CM | POA: Diagnosis not present

## 2023-07-23 DIAGNOSIS — Z0000 Encounter for general adult medical examination without abnormal findings: Secondary | ICD-10-CM | POA: Diagnosis not present

## 2023-07-23 DIAGNOSIS — F17210 Nicotine dependence, cigarettes, uncomplicated: Secondary | ICD-10-CM | POA: Diagnosis not present

## 2023-07-23 DIAGNOSIS — J432 Centrilobular emphysema: Secondary | ICD-10-CM | POA: Diagnosis not present

## 2023-07-23 DIAGNOSIS — R972 Elevated prostate specific antigen [PSA]: Secondary | ICD-10-CM | POA: Diagnosis not present

## 2023-07-23 DIAGNOSIS — G8929 Other chronic pain: Secondary | ICD-10-CM | POA: Diagnosis not present

## 2023-07-25 ENCOUNTER — Ambulatory Visit: Admitting: Urology

## 2023-07-30 ENCOUNTER — Ambulatory Visit: Admitting: Urology

## 2023-08-15 ENCOUNTER — Ambulatory Visit: Admitting: Urology

## 2023-08-15 VITALS — BP 151/80 | HR 58 | Ht 68.5 in | Wt 133.1 lb

## 2023-08-15 DIAGNOSIS — R972 Elevated prostate specific antigen [PSA]: Secondary | ICD-10-CM

## 2023-08-15 DIAGNOSIS — R350 Frequency of micturition: Secondary | ICD-10-CM

## 2023-08-15 DIAGNOSIS — C61 Malignant neoplasm of prostate: Secondary | ICD-10-CM | POA: Diagnosis not present

## 2023-08-15 LAB — MICROSCOPIC EXAMINATION: Epithelial Cells (non renal): >10 /HPF — AB (ref 0–10)

## 2023-08-15 LAB — URINALYSIS, COMPLETE
Bilirubin, UA: NEGATIVE
Glucose, UA: NEGATIVE
Nitrite, UA: NEGATIVE
Protein,UA: NEGATIVE
Specific Gravity, UA: 1.015 (ref 1.005–1.030)
Urobilinogen, Ur: 0.2 mg/dL (ref 0.2–1.0)
pH, UA: 6.5 (ref 5.0–7.5)

## 2023-08-15 NOTE — Progress Notes (Signed)
 Elfrieda Grise Plume,acting as a scribe for Dustin Gimenez, MD.,have documented all relevant documentation on the behalf of Dustin Gimenez, MD,as directed by  Dustin Gimenez, MD while in the presence of Dustin Gimenez, MD.  08/15/23 1:37 PM   Debi Fall 23-May-1950 [id number]  Referring provider: Antonio Baumgarten, MD [address] [facility] [place],  [place] [address]  Chief Complaint  Patient presents with   Establish Care   Elevated PSA    HPI:  73 year old male with a personal history of Gleason 4+3 prostate cancer initially presenting with a PSA of 23, who underwent IMRT in 2017 with Doctor Chrystal. He did not receive adjuvant ADT. His PSA nadir was around 0.6, but it started to rise in 2020, reaching 0.7 in 2022.   Unfortunately, his PSA has risen to 32.13 as of 07-16-2023, with a rapid doubling time. On serial labs, since 2012, it has been steadily rising, starting at 1.53 in 2022, 3.1. in 2023, 4.05 in 2024, and 8.65 just 6 months prior to that.   He reports urgency, frequency, and leakage since prostate radiation. He is concerned about the possibility of prostate cancer recurrence and is seeking further evaluation.   His urinalysis is negative aside from some epithelial cells.   He has a history of receiving Firmagon at [facility], which caused side effects. He also underwent a procedure in 2013 with Doctor Humphrey Magnuson. He experiences urinary symptoms, particularly in the morning after coffee consumption.  Results for orders placed or performed in visit on 08/15/23  Microscopic Examination   Urine  Result Value Ref Range   WBC, UA 6-10 (A) 0 - 5 /hpf   RBC, Urine 0-2 0 - 2 /hpf   Epithelial Cells (non renal) >10 (A) 0 - 10 /hpf   Bacteria, UA Few None seen/Few  Urinalysis, Complete  Result Value Ref Range   Specific Gravity, UA 1.015 1.005 - 1.030   pH, UA 6.5 5.0 - 7.5   Color, UA Yellow Yellow   Appearance Ur Clear Clear    Leukocytes,UA Trace (A) Negative   Protein,UA Negative Negative/Trace   Glucose, UA Negative Negative   Ketones, UA Trace (A) Negative   RBC, UA Trace (A) Negative   Bilirubin, UA Negative Negative   Urobilinogen, Ur 0.2 0.2 - 1.0 mg/dL   Nitrite, UA Negative Negative   Microscopic Examination See below:      PMH: Past Medical History:  Diagnosis Date   BP (high blood pressure) 05/10/2015   Current tobacco use 05/10/2015   Elevated PSA    Helicobacter pylori gastritis    Hypertension    Malignant neoplastic disease (HCC) 05/10/2015   Prostate cancer [facility])    Urinary urgency     Surgical History: Past Surgical History:  Procedure Laterality Date   GREEN LIGHT LASER TURP (TRANSURETHRAL RESECTION OF PROSTATE  09/2011   TONSILLECTOMY      Home Medications:  Allergies as of 08/15/2023       Reactions   Penicillins    Other reaction(s): Unknown        Medication List        Accurate as of August 15, 2023  1:37 PM. If you have any questions, ask your nurse or doctor.          STOP taking these medications    losartan 25 MG tablet Commonly known as: COZAAR       TAKE these medications    gabapentin 100 MG  capsule Commonly known as: NEURONTIN Take by mouth.   losartan-hydrochlorothiazide 50-12.5 MG tablet Commonly known as: HYZAAR Take 1 tablet by mouth daily.   meloxicam 7.5 MG tablet Commonly known as: MOBIC Take 7.5 mg by mouth daily.   tadalafil  5 MG tablet Commonly known as: CIALIS  Take by mouth.   tamsulosin  0.4 MG Caps capsule Commonly known as: FLOMAX  Take 1 capsule (0.4 mg total) by mouth daily after supper.        Allergies:  Allergies  Allergen Reactions   Penicillins     Other reaction(s): Unknown    Family History: Family History  Problem Relation Age of Onset   Coronary artery disease Father    Kidney Stones Brother    Hypertension Brother    Bladder Cancer Neg Hx    Prostate cancer Neg Hx    Kidney cancer Neg Hx      Social History:  reports that he has been smoking cigarettes. He has a 50 pack-year smoking history. He has never used smokeless tobacco. He reports current alcohol  use. He reports current drug use.   Physical Exam: BP (!) 151/80   Pulse (!) 58   Ht 5' 8.5" (1.74 m)   Wt 133 lb 2 oz (60.4 kg)   BMI 19.95 kg/m   Constitutional:  Alert and oriented, No acute distress. HEENT: [place] AT, moist mucus membranes.  Trachea midline, no masses. Neurologic: Grossly intact, no focal deficits, moving all 4 extremities. Psychiatric: Normal mood and affect.   Assessment & Plan:    1. Rising PSA and suspected recurrence of prostate cancer - His PSA has shown a rapid doubling time, indicating a probable recurrence of prostate cancer.  - A PSMA PET scan is ordered to assess the extent of disease spread. - Discussed potential treatment options, including hormone therapy to lower testosterone levels and possibly chemotherapy, depending on the PET scan results. - He has a history of adverse effects from Firmagon (degarelix) and is concerned about side effects. Alternative hormone therapies, such as anti-hormone pills, may be considered. - Referral to a medical oncologist at the cancer center will be made based on PET scan findings.  2. Urinary symptoms post-radiation - He experiences urgency, frequency, and leakage, particularly in the morning after coffee consumption. - Advised that coffee is a bladder irritant and may exacerbate symptoms.  Return in about 1 month (around 09/14/2023) for review of PET scan results and finalize treatment plan.  I have reviewed the above documentation for accuracy and completeness, and I agree with the above.   Dustin Gimenez, MD   [facility] [address] [place], [place] [address] [phone]

## 2023-08-27 ENCOUNTER — Other Ambulatory Visit

## 2023-08-27 ENCOUNTER — Ambulatory Visit
Admission: RE | Admit: 2023-08-27 | Discharge: 2023-08-27 | Disposition: A | Discharge status: Home / Self Care | Source: Ambulatory Visit | Attending: Urology | Admitting: Urology

## 2023-08-27 DIAGNOSIS — I700 Atherosclerosis of aorta: Secondary | ICD-10-CM | POA: Insufficient documentation

## 2023-08-27 DIAGNOSIS — R350 Frequency of micturition: Secondary | ICD-10-CM | POA: Diagnosis not present

## 2023-08-27 DIAGNOSIS — C61 Malignant neoplasm of prostate: Secondary | ICD-10-CM | POA: Diagnosis not present

## 2023-08-27 DIAGNOSIS — R972 Elevated prostate specific antigen [PSA]: Secondary | ICD-10-CM

## 2023-08-27 DIAGNOSIS — R9721 Rising PSA following treatment for malignant neoplasm of prostate: Secondary | ICD-10-CM | POA: Insufficient documentation

## 2023-08-27 DIAGNOSIS — I351 Nonrheumatic aortic (valve) insufficiency: Secondary | ICD-10-CM | POA: Insufficient documentation

## 2023-08-27 DIAGNOSIS — R911 Solitary pulmonary nodule: Secondary | ICD-10-CM | POA: Diagnosis not present

## 2023-08-27 MED ORDER — FLOTUFOLASTAT F 18 GALLIUM 296-5846 MBQ/ML IV SOLN
8.4100 | Freq: Once | INTRAVENOUS | Status: AC
  Administered 2023-08-27: 8.41 via INTRAVENOUS
  Filled 2023-08-27: qty 9

## 2023-09-04 ENCOUNTER — Ambulatory Visit: Admitting: Urology

## 2023-09-04 VITALS — BP 148/82 | HR 73 | Ht 68.5 in | Wt 133.0 lb

## 2023-09-04 DIAGNOSIS — Z191 Hormone sensitive malignancy status: Secondary | ICD-10-CM

## 2023-09-04 DIAGNOSIS — C61 Malignant neoplasm of prostate: Secondary | ICD-10-CM | POA: Diagnosis not present

## 2023-09-04 MED ORDER — ORGOVYX 120 MG PO TABS: ORAL_TABLET | ORAL | 11 refills | Status: DC

## 2023-09-04 MED ORDER — APALUTAMIDE 240 MG PO TABS: 240.0000 mg | ORAL_TABLET | Freq: Every day | ORAL | 11 refills | Status: DC

## 2023-09-04 NOTE — Patient Instructions (Signed)
 Apalutamide Tablets What is this medication? APALUTAMIDE (A puh LOO tuh mide) treats prostate cancer. It works by blocking the effect of testosterone and other hormones in your body, which may slow or stop cancer cells from spreading or growing. This medicine may be used for other purposes; ask your health care provider or pharmacist if you have questions. COMMON BRAND NAME(S): ERLEADA What should I tell my care team before I take this medication? They need to know if you have any of these conditions: Brain tumor Diabetes Head injury Heart disease High blood pressure High cholesterol Osteoporosis, weak bones Seizures Stroke An unusual or allergic reaction to apalutamide, other medications, foods, dyes, or preservatives If you or your partner are pregnant or trying to get pregnant Breastfeeding How should I use this medication? Take this medication by mouth. Take it as directed on the prescription label at the same time every day. Do not cut, crush, or chew this medication. Swallow the tablets whole. If you cannot swallow tablets whole, talk to your care team about how to mix it with applesauce, water, or juice. You can take it with or without food. If it upsets your stomach, take it with food. Your care team may change your dose or tell you to stop taking this medication if you get side effects. Do not change your dose or stop taking it unless your care team tells you to. This medication comes with INSTRUCTIONS FOR USE. Ask your pharmacist for directions on how to use this medication. Read the information carefully. Talk to your pharmacist or care team if you have questions. Talk to your care team about the use of this medication in children. Special care may be needed. Overdosage: If you think you have taken too much of this medicine contact a poison control center or emergency room at once. NOTE: This medicine is only for you. Do not share this medicine with others. What if I miss a  dose? If you miss a dose, take it as soon as you can. If it is almost time for your next dose, take only that dose. Do not take double or extra doses. What may interact with this medication? Do not take this medication with any of the following: Artemether; Lumefantrine Certain antivirals for HIV or hepatitis Isavuconazonium Lonafarnib Lorlatinib Lurasidone Mavacamten Pacritinib Praziquantel Ranolazine Voriconazole This medication may also interact with the following: Gemfibrozil Itraconazole Ketoconazole Midazolam Omeprazole Rifampin Warfarin This list may not describe all possible interactions. Give your health care provider a list of all the medicines, herbs, non-prescription drugs, or dietary supplements you use. Also tell them if you smoke, drink alcohol , or use illegal drugs. Some items may interact with your medicine. What should I watch for while using this medication? This medication may make you feel generally unwell. This is not uncommon as chemotherapy can affect healthy cells as well as cancer cells. Report any side effects. Continue your course of treatment even though you feel ill unless your care team tells you to stop. Using this medication for a long time may weaken your bones. The risk of bone fractures may be increased. Talk to your care team about your bone health. This medication may cause serious skin reactions. They can happen weeks to months after starting the medication. Contact your care team right away if you notice fevers or flu-like symptoms with a rash. The rash may be red or purple and then turn into blisters or peeling of the skin. You may also notice a red rash with  swelling of the face, lips, or lymph nodes in your neck or under your arms. Heart attacks and strokes have been reported with the use of this medication. Get emergency help if you develop signs or symptoms of a heart attack or stroke. Talk to your care team about the risks and benefits of this  medication. If your partner can get pregnant, use a condom during sex while taking this medication and for 3 months after the last dose. Tell your care team right away if you think your partner might be pregnant. This medication can cause serious birth defects. This medication may cause infertility. Talk to your care team if you are concerned about your fertility. Do not donate sperm while taking this medication and for 3 months after the last dose. What side effects may I notice from receiving this medication? Side effects that you should report to your care team as soon as possible: Allergic reactions--skin rash, itching, hives, swelling of the face, lips, tongue, or throat Dry cough, shortness of breath or trouble breathing Heart attack--pain or tightness in the chest, shoulders, arms, or jaw, nausea, shortness of breath, cold or clammy skin, feeling faint or lightheaded Increase in blood pressure Rash, fever, and swollen lymph nodes Redness, blistering, peeling, or loosening of the skin, including inside the mouth Seizures Stroke--sudden numbness or weakness of the face, arm, or leg, trouble speaking, confusion, trouble walking, loss of balance or coordination, dizziness, severe headache, change in vision Side effects that usually do not require medical attention (report these to your care team if they continue or are bothersome): Fatigue Hot flashes Joint pain Loss of appetite This list may not describe all possible side effects. Call your doctor for medical advice about side effects. You may report side effects to FDA at 1-800-FDA-1088. Where should I keep my medication? Keep out of the reach of children and pets. Store at room temperature between 20 and 25 degrees C (68 and 77 degrees F). Keep this medication in the original packaging. Protect from moisture. Keep the container tightly closed. Do not throw out the packet in the container. It keeps the medication dry. Protect from light.  Get rid of any unused medication after the expiration date. To get rid of medications that are no longer needed or have expired: Take the medication to a medication take-back program. Check with your pharmacy or law enforcement to find a location. If you cannot return the medication, check the label or package insert to see if the medication should be thrown out in the garbage or flushed down the toilet. If you are not sure, ask your care team. If it is safe to put in the trash, empty the medication out of the container. Mix the medication with cat litter, dirt, coffee grounds, or other unwanted substance. Seal the mixture in a bag or container. Put it in the trash. NOTE: This sheet is a summary. It may not cover all possible information. If you have questions about this medicine, talk to your doctor, pharmacist, or health care provider.  2024 Elsevier/Gold Standard (2023-03-22 00:00:00)

## 2023-09-04 NOTE — Progress Notes (Signed)
 Elfrieda Grise Plume,acting as a scribe for Dustin Gimenez, MD.,have documented all relevant documentation on the behalf of Dustin Gimenez, MD,as directed by  Dustin Gimenez, MD while in the presence of Dustin Gimenez, MD.  09/04/23 4:36 PM   Debi Fall 07/03/1950 [id number]  Referring provider: Antonio Baumgarten, MD [address] [facility] [place],  [place] [address]  Chief Complaint  Patient presents with   Results    HPI:  73 year old male with a personal history of Gleason 4+3 prostate cancer initially presenting with a PSA of 23, who underwent IMRT in 2017 with Doctor Chrystal. He did not receive adjuvant ADT. His PSA nadir was around 0.6, but it started to rise in 2020, reaching 0.7 in 2022.   He was initially diagnosed with prostate cancer in 2012, with a rising PSA now at 32.13, concerning for recurrence. He has previously been treated with Firmagon and experienced adverse reactions at the injection site.   A recent PET scan on 08/27/2023 shows diffuse avid radiotracer activity in the prostate, left common iliac, retroperitoneal nodes up to the level of the kidney, left periaortic lymph nodes, and some retrocrural lymph nodes posterior to the aorta at the level of the diaphragmatic hiatus, consistent with diffuse metastatic node involvement. There is no evidence of solid organ involvement.   He is concerned about the side effects of hormone therapy, including hot flashes, weight gain, and loss of muscle mass. He expresses a preference for oral medication over injections due to past adverse reactions.  PMH: Past Medical History:  Diagnosis Date   BP (high blood pressure) 05/10/2015   Current tobacco use 05/10/2015   Elevated PSA    Helicobacter pylori gastritis    Hypertension    Malignant neoplastic disease (HCC) 05/10/2015   Prostate cancer [facility])    Urinary urgency     Surgical History: Past Surgical History:  Procedure Laterality Date   GREEN LIGHT  LASER TURP (TRANSURETHRAL RESECTION OF PROSTATE  09/22/2011   TONSILLECTOMY      Home Medications:  Allergies as of 09/04/2023       Reactions   Penicillins    Other reaction(s): Unknown        Medication List        Accurate as of Sep 04, 2023  4:36 PM. If you have any questions, ask your nurse or doctor.          apalutamide  240 MG tablet Commonly known as: Erleada  Take 1 tablet (240 mg total) by mouth daily.   gabapentin 100 MG capsule Commonly known as: NEURONTIN Take by mouth.   losartan-hydrochlorothiazide 50-12.5 MG tablet Commonly known as: HYZAAR Take 1 tablet by mouth daily.   meloxicam 7.5 MG tablet Commonly known as: MOBIC Take 7.5 mg by mouth daily.   Orgovyx  120 MG tablet Generic drug: relugolix  Take 3 tablets on day one, and take 1 tablet once daily there after   tadalafil  5 MG tablet Commonly known as: CIALIS  Take by mouth.   tamsulosin  0.4 MG Caps capsule Commonly known as: FLOMAX  Take 1 capsule (0.4 mg total) by mouth daily after supper.        Allergies:  Allergies  Allergen Reactions   Penicillins     Other reaction(s): Unknown    Family History: Family History  Problem Relation Age of Onset   Coronary artery disease Father    Kidney Stones Brother    Hypertension Brother    Bladder Cancer Neg Hx  Prostate cancer Neg Hx    Kidney cancer Neg Hx     Social History:  reports that he has been smoking cigarettes. He has a 50 pack-year smoking history. He has never used smokeless tobacco. He reports current alcohol  use. He reports current drug use.   Physical Exam: BP (!) 148/82   Pulse 73   Ht 5' 8.5" (1.74 m)   Wt 133 lb (60.3 kg)   BMI 19.93 kg/m   Constitutional:  Alert and oriented, No acute distress. HEENT: [place] AT, moist mucus membranes.  Trachea midline, no masses. Neurologic: Grossly intact, no focal deficits, moving all 4 extremities. Psychiatric: Normal mood and affect.  Pertinent Imaging: EXAM: NUCLEAR  MEDICINE PET SKULL BASE TO THIGH   TECHNIQUE: 8.4 mCi Flotufolastat (Posluma ) was injected intravenously. Full-ring PET imaging was performed from the skull base to thigh after the radiotracer. CT data was obtained and used for attenuation correction and anatomic localization.   COMPARISON:  FDG PET scan 06/24/2020   FINDINGS: NECK   No radiotracer activity in neck lymph nodes.   Incidental CT finding: None.   CHEST   No radiotracer accumulation within mediastinal or hilar lymph nodes. No suspicious pulmonary nodules on the CT scan.   Incidental CT finding: Persistent nodule in the RIGHT middle lobe measures 9 mm compared to 11 mm on comparison PET-CT scan. No significant radiotracer activity. No significant metabolic activity on comparison PET-CT scan.   ABDOMEN/PELVIS   Prostate: No focal activity in prostatectomy bed.   Lymph nodes: Radiotracer avid presacral lymph node measures 10 mm (image 134 with SUV max equal 7.8.   Lymph node anterior to the S1 vertebral body measuring 7 mm (image 123 with SUV max equal 10.2.   LEFT common iliac node image 119 measures 6 mm with SUV max equal 13.8. Larger   Retroperitoneal nodes at the level of the kidneys are intense radiotracer avid. Cluster of nodes LEFT of the aorta measuring 17 mm with SUV max equal 15.1 on image 101.   Probable small retrocrural node posterior to the aorta the level the diaphragmatic hiatus with SUV max equal 7.6 on image 90.   Liver: No evidence of liver metastasis.   Incidental CT finding: Atherosclerotic calcification of the aorta.   SKELETON   No focal activity to suggest skeletal metastasis.   IMPRESSION: 1. Intense radiotracer avid lymph nodes in the pelvis, periaortic retroperitoneum and retrocrural space consistent with nodal metastasis. 2. No evidence of local prostate carcinoma recurrence in the prostatectomy bed. 3. No evidence of visceral metastasis or skeletal metastasis. 4.  Persistent RIGHT middle lobe pulmonary nodule without radiotracer activity. Favor benign pulmonary nodule. 5.  Aortic Atherosclerosis (ICD10-I70.0).     Electronically Signed   By: Deboraha Fallow M.D.   On: 08/27/2023 16:21  This was personally reviewed and I agree with the radiologic interpretation.  Assessment & Plan:    1. Metastatic Castrate-Sensitive Prostate Cancer - He has a history of metastatic prostate cancer with a rising PSA, currently at 32.13, indicating recurrence.  - A recent PET scan shows diffuse avid radio tracer activity in lymph nodes, consistent with metastatic involvement, but no solid organ involvement.  - The standard of care for castrate-sensitive prostate cancer is androgen deprivation therapy (ADT).  - He has a history of adverse reactions to Degarelix Tracy Friedlander) injections, with persistent skin issues at the injection site. Therefore, the plan is to initiate treatment with Orgovyx  and Erleada . Orgovyx  will be started with a loading dose  of 360 mg on day one, followed by 120 mg daily. Erleada  will be started at 240 mg daily, with the possibility of dose reduction if needed.  - He will be provided with samples to start the treatment.  - He is advised to maintain an active lifestyle and take calcium and vitamin D supplements to mitigate potential bone density loss associated with ADT.  - A follow-up appointment is scheduled in six weeks with one of the provider's partners to assess PSA levels and medication tolerance.  Return in about 6 weeks (around 10/16/2023) for PA visit with PSA, IPSS, PVR.  I have reviewed the above documentation for accuracy and completeness, and I agree with the above.   Dustin Gimenez, MD    [facility] [address], [address] [place], [place] [address] [phone]

## 2023-09-17 ENCOUNTER — Telehealth: Payer: Self-pay

## 2023-09-17 NOTE — Telephone Encounter (Signed)
 Patient Name:  Mario Hernandez  DOB:   Feb 10, 1951  Drug:  Erleada  & Orgovyx    Copay:  $0   Prior Auth Date: (if required) 09/06/23-09/04/24           Insurance:  [employer] (RXADV)           Before funding:  [phone]           After funding:  $0     Financial Assistance:             Source:  TAF           Dollar amount: Full Grant    Ship Date:  05/23   Expected Delivery Date:  05/27

## 2023-10-14 ENCOUNTER — Other Ambulatory Visit

## 2023-10-14 DIAGNOSIS — C61 Malignant neoplasm of prostate: Secondary | ICD-10-CM | POA: Diagnosis not present

## 2023-10-14 DIAGNOSIS — Z191 Hormone sensitive malignancy status: Secondary | ICD-10-CM

## 2023-10-15 LAB — PSA: Prostate Specific Ag, Serum: 0.2 ng/mL (ref 0.0–4.0)

## 2023-10-16 ENCOUNTER — Ambulatory Visit: Payer: Self-pay | Admitting: Urology

## 2023-10-17 ENCOUNTER — Encounter: Payer: Self-pay | Admitting: Physician Assistant

## 2023-10-17 ENCOUNTER — Ambulatory Visit: Admitting: Physician Assistant

## 2023-10-17 VITALS — BP 126/84 | HR 87 | Ht 68.5 in | Wt 127.0 lb

## 2023-10-17 DIAGNOSIS — C61 Malignant neoplasm of prostate: Secondary | ICD-10-CM | POA: Diagnosis not present

## 2023-10-17 DIAGNOSIS — Z191 Hormone sensitive malignancy status: Secondary | ICD-10-CM | POA: Diagnosis not present

## 2023-10-17 LAB — BLADDER SCAN AMB NON-IMAGING

## 2023-10-17 NOTE — Patient Instructions (Signed)
 Please take the following dietary supplements for the duration of your hormone suppression therapy to reduce your risk for bone loss: -Calcium 1000-1200mg  daily -Vitamin D 800-1000IU daily

## 2023-10-17 NOTE — Progress Notes (Signed)
 10/17/2023 1:29 PM   Mario Hernandez 04-Apr-1951 [id number]  CC: Chief Complaint  Patient presents with   Follow-up   HPI: Mario Hernandez is a 73 y.o. 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   PSA recheck 3 days ago had come down considerably from prior, now 0.2, previously 32.13.  Today he reports he is tired rating Orgovyx  and Erleada  well.  Side effects include gas and hot flashes.  His voiding symptoms have improved in the last month and he feels he is emptying better, but he still has some bothersome frequency.  Overall he is not terribly bothered by this and would like to avoid additional medications.  He is taking a daily multivitamin that contains calcium and vitamin D.  IPSS 18/mostly dissatisfied as below. PVR 0mL.   IPSS     Row Name 10/17/23 1300         International Prostate Symptom Score   How often have you had the sensation of not emptying your bladder? About half the time     How often have you had to urinate less than every two hours? About half the time     How often have you found you stopped and started again several times when you urinated? Less than half the time     How often have you found it difficult to postpone urination? More than half the time     How often have you had a weak urinary stream? Not at All     How often have you had to strain to start urination? Less than half the time     How many times did you typically get up at night to urinate? 4 Times     Total IPSS Score 18       Quality of Life due to urinary symptoms   If you were to spend the rest of your life with your urinary condition just the way it is now how would you feel about that? Mostly Disatisfied         PMH: Past Medical History:  Diagnosis Date   BP (high blood pressure) 05/10/2015   Current tobacco use 05/10/2015   Elevated PSA    Helicobacter pylori gastritis     Hypertension    Malignant neoplastic disease (HCC) 05/10/2015   Prostate cancer [facility])    Urinary urgency     Surgical History: Past Surgical History:  Procedure Laterality Date   GREEN LIGHT LASER TURP (TRANSURETHRAL RESECTION OF PROSTATE  09/22/2011   TONSILLECTOMY      Home Medications:  Allergies as of 10/17/2023       Reactions   Penicillins    Other reaction(s): Unknown        Medication List        Accurate as of October 17, 2023  1:29 PM. If you have any questions, ask your nurse or doctor.          apalutamide  240 MG tablet Commonly known as: Erleada  Take 1 tablet (240 mg total) by mouth daily.   gabapentin 100 MG capsule Commonly known as: NEURONTIN Take by mouth.   losartan-hydrochlorothiazide 50-12.5 MG tablet Commonly known as: HYZAAR Take 1 tablet by mouth daily.   meloxicam 7.5 MG tablet Commonly known as: MOBIC Take 7.5 mg by mouth daily.   Orgovyx  120 MG tablet Generic drug: relugolix  Take 3 tablets on day one, and take  1 tablet once daily there after   tadalafil  5 MG tablet Commonly known as: CIALIS  Take by mouth.   tamsulosin  0.4 MG Caps capsule Commonly known as: FLOMAX  Take 1 capsule (0.4 mg total) by mouth daily after supper.        Allergies:  Allergies  Allergen Reactions   Penicillins     Other reaction(s): Unknown    Family History: Family History  Problem Relation Age of Onset   Coronary artery disease Father    Kidney Stones Brother    Hypertension Brother    Bladder Cancer Neg Hx    Prostate cancer Neg Hx    Kidney cancer Neg Hx     Social History:   reports that he has been smoking cigarettes. He has a 50 pack-year smoking history. He has never used smokeless tobacco. He reports current alcohol  use. He reports current drug use.  Physical Exam: There were no vitals taken for this visit.  Constitutional:  Alert and oriented, no acute distress, nontoxic appearing HEENT: [place], [place] Cardiovascular: No clubbing,  cyanosis, or edema Respiratory: Normal respiratory effort, no increased work of breathing Skin: No rashes, bruises or suspicious lesions Neurologic: Grossly intact, no focal deficits, moving all 4 extremities Psychiatric: Normal mood and affect  Laboratory Data: Results for orders placed or performed in visit on 10/17/23  BLADDER SCAN AMB NON-IMAGING   Collection Time: 10/17/23  1:35 PM  Result Value Ref Range   Scan Result 0ml    Assessment & Plan:   1. Metastatic castration-sensitive adenocarcinoma of prostate (HCC) (Primary) Tolerating Orgovyx  and Erleada  well.  I gave him the target doses for calcium and vitamin D supplementation in his AVS today, as I suspect the doses in his daily multivitamin may not be high enough for fracture prevention.  Will defer OAB medications for frequency per his request, though may revisit this in the future per his preference.  We discussed that with Dr. Bjorn departure, we will need to transfer his care to another provider at our practice.  He prefers closer follow-up, which is reasonable, so we will set him up with Dr. Francisca in 3 months.  Also, since he is on Erleada , I would like to get the cancer center involved with his care to help manage this particular medication. - BLADDER SCAN AMB NON-IMAGING - Ambulatory referral to Hematology / Oncology   Return in about 3 months (around 01/17/2024) for IPSS/PVR/PSA prior.  Lucie Hones, PA-C  [facility] [address] [place], [place] [address] [phone]

## 2023-10-21 ENCOUNTER — Encounter: Payer: Self-pay | Admitting: Oncology

## 2023-10-21 ENCOUNTER — Inpatient Hospital Stay

## 2023-10-21 ENCOUNTER — Telehealth: Payer: Self-pay | Admitting: Pharmacist

## 2023-10-21 ENCOUNTER — Other Ambulatory Visit (HOSPITAL_COMMUNITY): Payer: Self-pay

## 2023-10-21 ENCOUNTER — Inpatient Hospital Stay: Attending: Oncology | Admitting: Oncology

## 2023-10-21 VITALS — BP 112/74 | HR 67 | Temp 98.0°F | Resp 16 | Ht 68.5 in | Wt 130.0 lb

## 2023-10-21 DIAGNOSIS — Z923 Personal history of irradiation: Secondary | ICD-10-CM | POA: Diagnosis not present

## 2023-10-21 DIAGNOSIS — C61 Malignant neoplasm of prostate: Secondary | ICD-10-CM | POA: Insufficient documentation

## 2023-10-21 DIAGNOSIS — C7989 Secondary malignant neoplasm of other specified sites: Secondary | ICD-10-CM

## 2023-10-21 DIAGNOSIS — F17210 Nicotine dependence, cigarettes, uncomplicated: Secondary | ICD-10-CM | POA: Diagnosis not present

## 2023-10-21 DIAGNOSIS — R9721 Rising PSA following treatment for malignant neoplasm of prostate: Secondary | ICD-10-CM | POA: Insufficient documentation

## 2023-10-21 NOTE — Progress Notes (Signed)
 [facility]  Telephone:(336) [phone] [phone]  ID: Mario Hernandez OB: [birth year]  MR#: [id number]  RDW#:[id number]  Patient Care Team: Sadie Manna, MD as PCP - General (Internal Medicine)  CHIEF COMPLAINT: Stage IV prostate cancer, Gleason 4+3.  INTERVAL HISTORY: Patient is a 73 year old male who was initially diagnosed with prostate cancer in 2017 and underwent IMRT.  He did not receive adjuvant ADT at that time.  Recently his PSA increased to greater than 32 suspicious for recurrence.  PET scan on Aug 27, 2023 was positive for multiple areas of uptake in pelvic/abdominal lymph nodes consistent with metastatic disease.  There was no evidence of solid organ or bony involvement.  He was started on Orgovyx  and Erleada  and is tolerating treatment well.  He currently feels well.  He has no neurologic complaints.  He denies any recent fevers or illnesses.  He has a good appetite and denies weight loss.  He has no chest pain, shortness of breath, cough, or hemoptysis.  He denies any nausea, vomiting, constipation, or diarrhea.  He has no urinary complaints.  Patient offers no specific complaints today.  REVIEW OF SYSTEMS:   Review of Systems  Constitutional: Negative.  Negative for fever, malaise/fatigue and weight loss.  Respiratory: Negative.  Negative for cough, hemoptysis and shortness of breath.   Cardiovascular: Negative.  Negative for chest pain and leg swelling.  Gastrointestinal: Negative.  Negative for abdominal pain.  Genitourinary: Negative.  Negative for dysuria.  Musculoskeletal: Negative.  Negative for back pain.  Skin: Negative.  Negative for rash.  Neurological: Negative.  Negative for dizziness, focal weakness and headaches.  Psychiatric/Behavioral: Negative.  The patient is not nervous/anxious.     As per HPI. Otherwise, a complete review of systems is negative.  PAST MEDICAL HISTORY: Past Medical History:  Diagnosis Date   BP (high blood  pressure) 05/10/2015   Current tobacco use 05/10/2015   Elevated PSA    Helicobacter pylori gastritis    Hypertension    Malignant neoplastic disease (HCC) 05/10/2015   Prostate cancer (HCC)    Urinary urgency     PAST SURGICAL HISTORY: Past Surgical History:  Procedure Laterality Date   GREEN LIGHT LASER TURP (TRANSURETHRAL RESECTION OF PROSTATE  09/22/2011   TONSILLECTOMY      FAMILY HISTORY: Family History  Problem Relation Age of Onset   Coronary artery disease Father    Kidney Stones Brother    Diabetes Brother    Hypertension Brother    Bladder Cancer Neg Hx    Prostate cancer Neg Hx    Kidney cancer Neg Hx     ADVANCED DIRECTIVES (Y/N):  N  HEALTH MAINTENANCE: Social History   Tobacco Use   Smoking status: Every Day    Current packs/day: 1.00    Average packs/day: 1 pack/day for 50.0 years (50.0 ttl pk-yrs)    Types: Cigarettes   Smokeless tobacco: Never  Substance Use Topics   Alcohol  use: Yes    Alcohol /week: 0.0 standard drinks of alcohol    Drug use: Not Currently     Colonoscopy:  PAP:  Bone density:  Lipid panel:  Allergies  Allergen Reactions   Penicillins     Other reaction(s): Unknown    Current Outpatient Medications  Medication Sig Dispense Refill   apalutamide  (ERLEADA ) 240 MG tablet Take 1 tablet (240 mg total) by mouth daily. 30 tablet 11   gabapentin (NEURONTIN) 100 MG capsule Take by mouth.     losartan-hydrochlorothiazide (HYZAAR) 50-12.5 MG tablet  Take 1 tablet by mouth daily.     meloxicam (MOBIC) 7.5 MG tablet Take 7.5 mg by mouth daily.     Multiple Vitamin (MULTIVITAMIN) tablet Take 1 tablet by mouth daily.     relugolix  (ORGOVYX ) 120 MG tablet Take 3 tablets on day one, and take 1 tablet once daily there after 33 tablet 11   tadalafil  (CIALIS ) 5 MG tablet Take by mouth.     tamsulosin  (FLOMAX ) 0.4 MG CAPS capsule Take 1 capsule (0.4 mg total) by mouth daily after supper. 30 capsule 12   No current facility-administered  medications for this visit.    OBJECTIVE: Vitals:   10/21/23 1326  BP: 112/74  Pulse: 67  Resp: 16  Temp: 98 F (36.7 C)  SpO2: 99%     Body mass index is 19.48 kg/m.    ECOG FS:0 - Asymptomatic  General: Well-developed, well-nourished, no acute distress. Eyes: Pink conjunctiva, anicteric sclera. HEENT: Normocephalic, moist mucous membranes. Lungs: No audible wheezing or coughing. Heart: Regular rate and rhythm. Abdomen: Soft, nontender, no obvious distention. Musculoskeletal: No edema, cyanosis, or clubbing. Neuro: Alert, answering all questions appropriately. Cranial nerves grossly intact. Skin: No rashes or petechiae noted. Psych: Normal affect. Lymphatics: No cervical, calvicular, axillary or inguinal LAD.   LAB RESULTS:  Lab Results  Component Value Date   K 4.0 09/20/2011    Lab Results  Component Value Date   WBC 3.3 (L) 08/15/2015   HGB 14.6 08/15/2015   HCT 41.9 08/15/2015   MCV 91.9 08/15/2015   PLT 158 08/15/2015     STUDIES: No results found.  ASSESSMENT: Stage IV prostate cancer, Gleason 4+3.  PLAN:    Stage IV prostate cancer, Gleason 4+3: initially diagnosed with prostate cancer in 2017 and underwent IMRT.  He did not receive adjuvant ADT at that time.  Recently his PSA increased to 32.13 suspicious for recurrence.  PSMA PET scan on Aug 27, 2023 was positive for multiple areas of uptake in the pelvic, periaortic, retroperitoneal, and retrocrural space consistent with nodal metastatic disease.  There was no evidence of solid organ or bony involvement.  He was started on Orgovyx  and Erleada  in May 2025 and is tolerating treatment well.  Patient had previous adverse reaction to Firmagon injections.  His most recent PSA on October 14, 2023 was reported at 0.2.  No further intervention is needed.  Return to clinic in 1 month with repeat laboratory work and further evaluation.  I spent a total of 60 minutes reviewing chart data, face-to-face evaluation with  the patient, counseling and coordination of care as detailed above.   Patient expressed understanding and was in agreement with this plan. He also understands that He can call clinic at any time with any questions, concerns, or complaints.    Cancer Staging  No matching staging information was found for the patient.   Evalene JINNY Reusing, MD   10/21/2023 3:20 PM

## 2023-10-21 NOTE — Telephone Encounter (Signed)
 Clinical Pharmacist Practitioner Encounter   Received prescription for Erleada  (apalutamide ) and Orgovyx  (relugolix ) for the treatment of stage IV prostate cancer, planned duration until disease progression or unacceptable drug toxicity.  Patient is existing on treatment but transitioning care to [facility].   Prescription dose and frequency assessed.   Current medication list in Epic reviewed, one DDIs with apalutamide  identified and no interactions with relugolix . Tadalafil : apaultamide may decrease serum concentration of tadalafil . Monitor for reduce clinical effectiveness of tadalafil .   Evaluated chart and no patient barriers to medication adherence identified.   Prescription has been e-scribed to the [facility].  Oral Oncology Clinic will continue to follow for set-up with [facility] (Specialty)   Renaee GEANNIE Ditch, PharmD, BCOP, CPP Hematology/Oncology Clinical Pharmacist [facility] Oral Chemotherapy Navigation Clinic [phone]  10/21/2023 3:23 PM

## 2023-10-30 ENCOUNTER — Other Ambulatory Visit (HOSPITAL_COMMUNITY): Payer: Self-pay

## 2023-10-30 MED ORDER — APALUTAMIDE 240 MG PO TABS
240.0000 mg | ORAL_TABLET | Freq: Every day | ORAL | 3 refills | Status: DC
Start: 2023-10-30 — End: 2024-02-13
  Filled 2023-11-01: qty 30, 30d supply, fill #0
  Filled 2023-11-27 – 2023-12-17 (×2): qty 30, 30d supply, fill #1
  Filled 2024-01-09: qty 30, 30d supply, fill #2
  Filled 2024-02-05: qty 30, 30d supply, fill #3

## 2023-10-30 MED ORDER — ORGOVYX 120 MG PO TABS
120.0000 mg | ORAL_TABLET | Freq: Every day | ORAL | 3 refills | Status: DC
  Filled 2023-11-01: qty 30, 30d supply, fill #0
  Filled 2023-11-27 – 2023-12-17 (×2): qty 30, 30d supply, fill #1
  Filled 2024-01-09: qty 30, 30d supply, fill #2
  Filled 2024-02-05: qty 30, 30d supply, fill #3

## 2023-11-01 ENCOUNTER — Telehealth: Payer: Self-pay | Admitting: Pharmacy Technician

## 2023-11-01 ENCOUNTER — Other Ambulatory Visit: Payer: Self-pay | Admitting: Pharmacy Technician

## 2023-11-01 ENCOUNTER — Other Ambulatory Visit: Payer: Self-pay

## 2023-11-01 ENCOUNTER — Other Ambulatory Visit (HOSPITAL_COMMUNITY): Payer: Self-pay

## 2023-11-01 NOTE — Telephone Encounter (Signed)
 Oral Oncology Patient Advocate Encounter  Was successful in looking up patient's grant from [employer] to provide copayment coverage for Erleada  and Orgovyx .  This will keep the out of pocket expense at $0.    I have spoken with the patient.   The billing information is as follows and has been shared with [facility].    RxBin: [id number] PCN: [id number] Member ID: [id number] Group ID: [id number] Dates of Eligibility: 04/24/2023 through 04/22/2024  Fund:  Prostate Cancer Copay Assistance Program  Bunn[place] (Patty) Chet Burnet, CPhT  [facility], [place], [place], [place] Oral Chemotherapy Patient Advocate Phone: [phone]  Fax: [phone]

## 2023-11-01 NOTE — Progress Notes (Signed)
 Specialty Pharmacy Initial Fill Coordination Note  Mario Hernandez is a 73 y.o. male contacted today regarding refills of specialty medication(s) Apalutamide  (ERLEADA ); Relugolix  (Orgovyx ) .  Patient requested Delivery  on 11/05/23  to verified address [address] [place] [address]   Medication will be filled on 11/04/2023.   Patient is aware of $0 copayment for both medications. Bill grant secondary if appropriate.   Aeva Posey (Patty) Chet Burnet, CPhT  [facility], [place], [place], [place] Oral Chemotherapy Patient Advocate Phone: [phone]  Fax: [phone]

## 2023-11-01 NOTE — Telephone Encounter (Signed)
 Oral Oncology Patient Advocate Encounter  I confirmed with patient that he has contacted [facility] and had them stop filling his Erleada  and Orgovyx .   He will now be filling at [facility].  Satori Krabill (Patty) Chet Burnet, CPhT  [facility], [place], [place], [place] Oral Chemotherapy Patient Advocate Phone: [phone]  Fax: [phone]

## 2023-11-01 NOTE — Progress Notes (Signed)
 Specialty Pharmacy Initiation Note   Mario Hernandez is a 73 y.o. male who will be followed by the specialty pharmacy service for RxSp Oncology    Review of administration, indication, effectiveness, safety, potential side effects, storage/disposable, and missed dose instructions occurred today for patient's specialty medication(s) Apalutamide  (ERLEADA ); Relugolix  (Orgovyx )     Patient/Caregiver did not have any additional questions or concerns.   Patient's therapy is appropriate to: Continue    Goals Addressed             This Visit's Progress    Slow Disease Progression       Patient is on track. Patient will maintain adherence.         Mario Hernandez, PharmD, BCPS, BCOP Hematology/Oncology Clinical Pharmacist [phone] 11/01/2023 9:29 AM

## 2023-11-01 NOTE — Telephone Encounter (Signed)
 Patient successfully On[employer]. Medication scheduled to be shipped on 07/14 for delivery on 07/15 from [facility] to patient's address. Patient also knows to call me at [phone] with any questions or concerns regarding receiving medication or if there is any unexpected change in co-pay.   Nitza Schmid (Patty) Chet Burnet, CPhT  [facility], [place], [place], [place] Oral Chemotherapy Patient Advocate Phone: [phone]  Fax: [phone]

## 2023-11-04 ENCOUNTER — Other Ambulatory Visit (HOSPITAL_COMMUNITY): Payer: Self-pay

## 2023-11-19 ENCOUNTER — Inpatient Hospital Stay

## 2023-11-19 ENCOUNTER — Encounter: Payer: Self-pay | Admitting: Oncology

## 2023-11-19 ENCOUNTER — Inpatient Hospital Stay: Attending: Oncology | Admitting: Oncology

## 2023-11-19 ENCOUNTER — Inpatient Hospital Stay: Admitting: Pharmacist

## 2023-11-19 VITALS — BP 134/84 | HR 67 | Temp 97.6°F | Resp 18 | Ht 68.0 in | Wt 126.0 lb

## 2023-11-19 DIAGNOSIS — C7989 Secondary malignant neoplasm of other specified sites: Secondary | ICD-10-CM

## 2023-11-19 DIAGNOSIS — C61 Malignant neoplasm of prostate: Secondary | ICD-10-CM | POA: Insufficient documentation

## 2023-11-19 DIAGNOSIS — Z79899 Other long term (current) drug therapy: Secondary | ICD-10-CM | POA: Diagnosis not present

## 2023-11-19 DIAGNOSIS — I10 Essential (primary) hypertension: Secondary | ICD-10-CM | POA: Insufficient documentation

## 2023-11-19 DIAGNOSIS — Z791 Long term (current) use of non-steroidal anti-inflammatories (NSAID): Secondary | ICD-10-CM | POA: Insufficient documentation

## 2023-11-19 DIAGNOSIS — Z191 Hormone sensitive malignancy status: Secondary | ICD-10-CM | POA: Diagnosis not present

## 2023-11-19 DIAGNOSIS — F17210 Nicotine dependence, cigarettes, uncomplicated: Secondary | ICD-10-CM | POA: Insufficient documentation

## 2023-11-19 LAB — CMP (CANCER CENTER ONLY)
ALT: 12 U/L (ref 0–44)
AST: 20 U/L (ref 15–41)
Albumin: 4.3 g/dL (ref 3.5–5.0)
Alkaline Phosphatase: 67 U/L (ref 38–126)
Anion gap: 9 (ref 5–15)
BUN: 12 mg/dL (ref 8–23)
CO2: 24 mmol/L (ref 22–32)
Calcium: 9.5 mg/dL (ref 8.9–10.3)
Chloride: 94 mmol/L — ABNORMAL LOW (ref 98–111)
Creatinine: 1 mg/dL (ref 0.61–1.24)
GFR, Estimated: >60 mL/min (ref >60)
Glucose, Bld: 94 mg/dL (ref 70–99)
Potassium: 4.1 mmol/L (ref 3.5–5.1)
Sodium: 127 mmol/L — ABNORMAL LOW (ref 135–145)
Total Bilirubin: 0.9 mg/dL (ref 0.0–1.2)
Total Protein: 6.8 g/dL (ref 6.5–8.1)

## 2023-11-19 LAB — CBC WITH DIFFERENTIAL/PLATELET
Abs Immature Granulocytes: 0.02 K/uL (ref 0.00–0.07)
Basophils Absolute: 0 K/uL (ref 0.0–0.1)
Basophils Relative: 1 %
Eosinophils Absolute: 0.1 K/uL (ref 0.0–0.5)
Eosinophils Relative: 1 %
HCT: 37.4 % — ABNORMAL LOW (ref 39.0–52.0)
Hemoglobin: 13.5 g/dL (ref 13.0–17.0)
Immature Granulocytes: 0 %
Lymphocytes Relative: 30 %
Lymphs Abs: 1.6 K/uL (ref 0.7–4.0)
MCH: 31.6 pg (ref 26.0–34.0)
MCHC: 36.1 g/dL — ABNORMAL HIGH (ref 30.0–36.0)
MCV: 87.6 fL (ref 80.0–100.0)
Monocytes Absolute: 0.5 K/uL (ref 0.1–1.0)
Monocytes Relative: 9 %
Neutro Abs: 3.1 K/uL (ref 1.7–7.7)
Neutrophils Relative %: 59 %
Platelets: 261 K/uL (ref 150–400)
RBC: 4.27 MIL/uL (ref 4.22–5.81)
RDW: 14 % (ref 11.5–15.5)
WBC: 5.3 K/uL (ref 4.0–10.5)
nRBC: 0 % (ref 0.0–0.2)

## 2023-11-19 LAB — PSA: Prostatic Specific Antigen: 0.01 ng/mL (ref 0.00–4.00)

## 2023-11-19 NOTE — Progress Notes (Unsigned)
 [facility]  Telephone:(336) [phone] Fax:(336) [phone]  ID: Mario Hernandez OB: 09-May-1950  MR#: [id number]  RDW#:[id number]  Patient Care Team: Sadie Manna, MD as PCP - General (Internal Medicine) Jacobo Evalene PARAS, MD as Consulting Physician (Oncology)  CHIEF COMPLAINT: Stage IV prostate cancer, Gleason 4+3.  INTERVAL HISTORY: Patient returns to clinic today for repeat laboratory work and further evaluation.  Currently feels well and is asymptomatic.  He is tolerating Orgovyx  and Erleada  without significant side effects. He has no neurologic complaints.  He denies any recent fevers or illnesses.  He has a good appetite and denies weight loss.  He has no chest pain, shortness of breath, cough, or hemoptysis.  He denies any nausea, vomiting, constipation, or diarrhea.  He has no urinary complaints.  Patient offers no specific complaints today.  REVIEW OF SYSTEMS:   Review of Systems  Constitutional: Negative.  Negative for fever, malaise/fatigue and weight loss.  Respiratory: Negative.  Negative for cough, hemoptysis and shortness of breath.   Cardiovascular: Negative.  Negative for chest pain and leg swelling.  Gastrointestinal: Negative.  Negative for abdominal pain.  Genitourinary: Negative.  Negative for dysuria.  Musculoskeletal: Negative.  Negative for back pain.  Skin: Negative.  Negative for rash.  Neurological: Negative.  Negative for dizziness, focal weakness and headaches.  Psychiatric/Behavioral: Negative.  The patient is not nervous/anxious.     As per HPI. Otherwise, a complete review of systems is negative.  PAST MEDICAL HISTORY: Past Medical History:  Diagnosis Date   BP (high blood pressure) 05/10/2015   Current tobacco use 05/10/2015   Elevated PSA    Helicobacter pylori gastritis    Hypertension    Malignant neoplastic disease (HCC) 05/10/2015   Prostate cancer (HCC)    Urinary urgency     PAST SURGICAL HISTORY: Past Surgical  History:  Procedure Laterality Date   GREEN LIGHT LASER TURP (TRANSURETHRAL RESECTION OF PROSTATE  09/22/2011   TONSILLECTOMY      FAMILY HISTORY: Family History  Problem Relation Age of Onset   Coronary artery disease Father    Kidney Stones Brother    Diabetes Brother    Hypertension Brother    Bladder Cancer Neg Hx    Prostate cancer Neg Hx    Kidney cancer Neg Hx     ADVANCED DIRECTIVES (Y/N):  N  HEALTH MAINTENANCE: Social History   Tobacco Use   Smoking status: Every Day    Current packs/day: 1.00    Average packs/day: 1 pack/day for 50.0 years (50.0 ttl pk-yrs)    Types: Cigarettes   Smokeless tobacco: Never  Substance Use Topics   Alcohol  use: Yes    Alcohol /week: 0.0 standard drinks of alcohol    Drug use: Not Currently     Colonoscopy:  PAP:  Bone density:  Lipid panel:  Allergies  Allergen Reactions   Penicillins     Other reaction(s): Unknown    Current Outpatient Medications  Medication Sig Dispense Refill   apalutamide  (ERLEADA ) 240 MG tablet Take 1 tablet (240 mg total) by mouth daily. 30 tablet 3   gabapentin (NEURONTIN) 100 MG capsule Take by mouth.     losartan-hydrochlorothiazide (HYZAAR) 50-12.5 MG tablet Take 1 tablet by mouth daily.     meloxicam (MOBIC) 7.5 MG tablet Take 7.5 mg by mouth daily.     Multiple Vitamin (MULTIVITAMIN) tablet Take 1 tablet by mouth daily.     relugolix  (ORGOVYX ) 120 MG tablet Take 1 tablet (120 mg total) by mouth daily.  30 tablet 3   tadalafil  (CIALIS ) 5 MG tablet Take by mouth.     tamsulosin  (FLOMAX ) 0.4 MG CAPS capsule Take 1 capsule (0.4 mg total) by mouth daily after supper. 30 capsule 12   No current facility-administered medications for this visit.    OBJECTIVE: Vitals:   11/19/23 1304  BP: 134/84  Pulse: 67  Resp: 18  Temp: 97.6 F (36.4 C)  SpO2: 100%     Body mass index is 19.16 kg/m.    ECOG FS:0 - Asymptomatic  General: Well-developed, well-nourished, no acute distress. Eyes: Pink  conjunctiva, anicteric sclera. HEENT: Normocephalic, moist mucous membranes. Lungs: No audible wheezing or coughing. Heart: Regular rate and rhythm. Abdomen: Soft, nontender, no obvious distention. Musculoskeletal: No edema, cyanosis, or clubbing. Neuro: Alert, answering all questions appropriately. Cranial nerves grossly intact. Skin: No rashes or petechiae noted. Psych: Normal affect.  LAB RESULTS:  Lab Results  Component Value Date   NA 127 (L) 11/19/2023   K 4.1 11/19/2023   CL 94 (L) 11/19/2023   CO2 24 11/19/2023   GLUCOSE 94 11/19/2023   BUN 12 11/19/2023   CREATININE 1.00 11/19/2023   CALCIUM 9.5 11/19/2023   PROT 6.8 11/19/2023   ALBUMIN 4.3 11/19/2023   AST 20 11/19/2023   ALT 12 11/19/2023   ALKPHOS 67 11/19/2023   BILITOT 0.9 11/19/2023   GFRNONAA >60 11/19/2023    Lab Results  Component Value Date   WBC 5.3 11/19/2023   NEUTROABS 3.1 11/19/2023   HGB 13.5 11/19/2023   HCT 37.4 (L) 11/19/2023   MCV 87.6 11/19/2023   PLT 261 11/19/2023     STUDIES: No results found.  ASSESSMENT: Stage IV prostate cancer, Gleason 4+3.  PLAN:    Stage IV prostate cancer, Gleason 4+3: initially diagnosed in 2017 and underwent IMRT.  He did not receive adjuvant ADT at that time.  Recently his PSA increased to 32.13 suspicious for recurrence.  PSMA PET scan on Aug 27, 2023 was positive for multiple areas of uptake in the pelvic, periaortic, retroperitoneal, and retrocrural space consistent with nodal metastatic disease.  There was no evidence of solid organ or bony involvement.  He was started on Orgovyx  and Erleada  in May 2025 and is tolerating treatment well.  Patient had previous adverse reaction to Firmagon injections.  His most recent PSA was reported at 0.01.  No further intervention is needed.  Return to clinic in 4 weeks for evaluation by clinical pharmacy and then in 8 weeks for further evaluation and continuation of treatment.   I spent a total of 30 minutes reviewing  chart data, face-to-face evaluation with the patient, counseling and coordination of care as detailed above.   Patient expressed understanding and was in agreement with this plan. He also understands that He can call clinic at any time with any questions, concerns, or complaints.    Cancer Staging  Prostate cancer [facility]) Staging form: Prostate, AJCC 7th Edition - Clinical: Stage IV (TX, N1, M1a, PSA: 20 or greater, Gleason 7) - Signed by Jacobo Evalene PARAS, MD on 11/20/2023 Stage prefix: Initial diagnosis   Evalene PARAS Jacobo, MD   11/20/2023 8:39 PM

## 2023-11-19 NOTE — Progress Notes (Signed)
 Clinical Pharmacist Practitioner Clinic [facility]  [phone] [phone]  Patient Care Team: Sadie Manna, MD as PCP - General (Internal Medicine) Jacobo Evalene PARAS, MD as Consulting Physician (Oncology)   Name of the patient: Mario Hernandez  [id number]  [birth year]   Date of visit: 11/19/23  HPI: Patient is a 73 y.o. male with stage IV prostate cancer. Currently being treated with apalutamide  and relugolix  which was started in 08/2023.   Reason for Consult: Oral chemotherapy follow-up for apalutamide  and relugolix  therapy.   PAST MEDICAL HISTORY: Past Medical History:  Diagnosis Date   BP (high blood pressure) 05/10/2015   Current tobacco use 05/10/2015   Elevated PSA    Helicobacter pylori gastritis    Hypertension    Malignant neoplastic disease (HCC) 05/10/2015   Prostate cancer [facility])    Urinary urgency     HEMATOLOGY/ONCOLOGY HISTORY:  Oncology History   No history exists.    ALLERGIES:  is allergic to penicillins.  MEDICATIONS:  Current Outpatient Medications  Medication Sig Dispense Refill   apalutamide  (ERLEADA ) 240 MG tablet Take 1 tablet (240 mg total) by mouth daily. 30 tablet 3   gabapentin (NEURONTIN) 100 MG capsule Take by mouth.     losartan-hydrochlorothiazide (HYZAAR) 50-12.5 MG tablet Take 1 tablet by mouth daily.     meloxicam (MOBIC) 7.5 MG tablet Take 7.5 mg by mouth daily.     Multiple Vitamin (MULTIVITAMIN) tablet Take 1 tablet by mouth daily.     relugolix  (ORGOVYX ) 120 MG tablet Take 1 tablet (120 mg total) by mouth daily. 30 tablet 3   tadalafil  (CIALIS ) 5 MG tablet Take by mouth.     tamsulosin  (FLOMAX ) 0.4 MG CAPS capsule Take 1 capsule (0.4 mg total) by mouth daily after supper. 30 capsule 12   No current facility-administered medications for this visit.    VITAL SIGNS: There were no vitals taken for this visit. There were no vitals filed for this visit.  Estimated body mass index is 19.16  kg/m as calculated from the following:   Height as of an earlier encounter on 11/19/23: 5' 8 (1.727 m).   Weight as of an earlier encounter on 11/19/23: 57.2 kg (126 lb).  LABS: CBC:    Component Value Date/Time   WBC 3.3 (L) 08/15/2015 1058   HGB 14.6 08/15/2015 1058   HGB 13.7 09/20/2011 1146   HCT 41.9 08/15/2015 1058   PLT 158 08/15/2015 1058   MCV 91.9 08/15/2015 1058   Comprehensive Metabolic Panel:    Component Value Date/Time   K 4.0 09/20/2011 1146     Present during today's visit: patient only  Assessment and Plan: CBC/CMP reviewed, continue apalutamide  240 mg daily and relugolix  120mg  daily Overall patient is tolerating treatment well Bowel movement changes: patient reported occasional constipation, recommended docusate as needed  Oral Chemotherapy Adherence: No missed doses reported No patient barriers to medication adherence identified.   New medications: None reported  Medication Access Issues: No issues patient fills at [facility] (Specialty)  Patient expressed understanding and was in agreement with this plan. He also understands that He can call clinic at any time with any questions, concerns, or complaints.   Follow-up plan: RTC as scheduled  Thank you for allowing me to participate in the care of this very pleasant patient.   Time Total: 10 mins  Visit consisted of counseling and education on dealing with issues of symptom management in the setting of serious and potentially life-threatening illness.Greater than  50%  of this time was spent counseling and coordinating care related to the above assessment and plan.  Signed by: Ashante Yellin N. Kwamane Whack, PharmD, NEILA, CPP Hematology/Oncology Clinical Pharmacist Practitioner [facility] [phone]  11/19/2023 1:11 PM

## 2023-11-22 ENCOUNTER — Other Ambulatory Visit: Payer: Self-pay

## 2023-11-27 ENCOUNTER — Other Ambulatory Visit: Payer: Self-pay

## 2023-11-28 ENCOUNTER — Other Ambulatory Visit: Payer: Self-pay

## 2023-11-29 ENCOUNTER — Other Ambulatory Visit: Payer: Self-pay

## 2023-12-03 ENCOUNTER — Other Ambulatory Visit: Payer: Self-pay

## 2023-12-17 ENCOUNTER — Inpatient Hospital Stay: Attending: Oncology

## 2023-12-17 ENCOUNTER — Other Ambulatory Visit: Payer: Self-pay

## 2023-12-17 ENCOUNTER — Inpatient Hospital Stay: Admitting: Pharmacist

## 2023-12-17 VITALS — BP 138/76 | HR 73 | Temp 97.2°F | Resp 16 | Wt 126.0 lb

## 2023-12-17 DIAGNOSIS — C61 Malignant neoplasm of prostate: Secondary | ICD-10-CM | POA: Insufficient documentation

## 2023-12-17 DIAGNOSIS — C7989 Secondary malignant neoplasm of other specified sites: Secondary | ICD-10-CM

## 2023-12-17 LAB — CMP (CANCER CENTER ONLY)
ALT: 13 U/L (ref 0–44)
AST: 20 U/L (ref 15–41)
Albumin: 4.2 g/dL (ref 3.5–5.0)
Alkaline Phosphatase: 67 U/L (ref 38–126)
Anion gap: 9 (ref 5–15)
BUN: 11 mg/dL (ref 8–23)
CO2: 23 mmol/L (ref 22–32)
Calcium: 9.4 mg/dL (ref 8.9–10.3)
Chloride: 96 mmol/L — ABNORMAL LOW (ref 98–111)
Creatinine: 0.84 mg/dL (ref 0.61–1.24)
GFR, Estimated: >60 mL/min (ref >60)
Glucose, Bld: 101 mg/dL — ABNORMAL HIGH (ref 70–99)
Potassium: 4.2 mmol/L (ref 3.5–5.1)
Sodium: 128 mmol/L — ABNORMAL LOW (ref 135–145)
Total Bilirubin: 0.7 mg/dL (ref 0.0–1.2)
Total Protein: 6.7 g/dL (ref 6.5–8.1)

## 2023-12-17 LAB — PSA: Prostatic Specific Antigen: <0.02 ng/mL (ref 0.00–4.00)

## 2023-12-17 LAB — CBC WITH DIFFERENTIAL (CANCER CENTER ONLY)
Abs Immature Granulocytes: 0.01 K/uL (ref 0.00–0.07)
Basophils Absolute: 0 K/uL (ref 0.0–0.1)
Basophils Relative: 1 %
Eosinophils Absolute: 0.1 K/uL (ref 0.0–0.5)
Eosinophils Relative: 2 %
HCT: 36.2 % — ABNORMAL LOW (ref 39.0–52.0)
Hemoglobin: 13.1 g/dL (ref 13.0–17.0)
Immature Granulocytes: 0 %
Lymphocytes Relative: 27 %
Lymphs Abs: 1.3 K/uL (ref 0.7–4.0)
MCH: 32.4 pg (ref 26.0–34.0)
MCHC: 36.2 g/dL — ABNORMAL HIGH (ref 30.0–36.0)
MCV: 89.6 fL (ref 80.0–100.0)
Monocytes Absolute: 0.5 K/uL (ref 0.1–1.0)
Monocytes Relative: 9 %
Neutro Abs: 3.1 K/uL (ref 1.7–7.7)
Neutrophils Relative %: 61 %
Platelet Count: 237 K/uL (ref 150–400)
RBC: 4.04 MIL/uL — ABNORMAL LOW (ref 4.22–5.81)
RDW: 14.3 % (ref 11.5–15.5)
WBC Count: 5 K/uL (ref 4.0–10.5)
nRBC: 0 % (ref 0.0–0.2)

## 2023-12-17 NOTE — Progress Notes (Signed)
 Specialty Pharmacy Refill Coordination Note  Mario Hernandez is a 73 y.o. male contacted today regarding refills of specialty medication(s) Apalutamide  (ERLEADA ); Relugolix  (Orgovyx )   Patient requested Delivery   Delivery date: 12/18/23   Verified address: [address] [place] [address]   Medication will be filled on 12/17/23.

## 2023-12-17 NOTE — Progress Notes (Signed)
 Clinical Pharmacist Practitioner Clinic [facility]  [phone] [phone]  Patient Care Team: Sadie Manna, MD as PCP - General (Internal Medicine) Jacobo Evalene PARAS, MD as Consulting Physician (Oncology)   Name of the patient: Mario Hernandez  [id number]  1951/01/16   Date of visit: 12/17/23  HPI: Patient is a 73 y.o. male with stage IV prostate cancer. Currently being treated with apalutamide  and relugolix  which was started in 08/2023.   Reason for Consult: Oral chemotherapy follow-up for apalutamide  and relugolix  therapy.   PAST MEDICAL HISTORY: Past Medical History:  Diagnosis Date   BP (high blood pressure) 05/10/2015   Current tobacco use 05/10/2015   Elevated PSA    Helicobacter pylori gastritis    Hypertension    Malignant neoplastic disease (HCC) 05/10/2015   Prostate cancer [facility])    Urinary urgency     HEMATOLOGY/ONCOLOGY HISTORY:  Oncology History  Prostate cancer (HCC)  05/24/2015 Initial Diagnosis   Prostate cancer (HCC)   11/20/2023 Cancer Staging   Staging form: Prostate, AJCC 7th Edition - Clinical: Stage IV (TX, N1, M1a, PSA: 20 or greater, Gleason 7) - Signed by Jacobo Evalene PARAS, MD on 11/20/2023 Stage prefix: Initial diagnosis     ALLERGIES:  is allergic to penicillins.  MEDICATIONS:  Current Outpatient Medications  Medication Sig Dispense Refill   apalutamide  (ERLEADA ) 240 MG tablet Take 1 tablet (240 mg total) by mouth daily. 30 tablet 3   gabapentin (NEURONTIN) 100 MG capsule Take by mouth.     losartan-hydrochlorothiazide (HYZAAR) 50-12.5 MG tablet Take 1 tablet by mouth daily.     meloxicam (MOBIC) 7.5 MG tablet Take 7.5 mg by mouth daily.     Multiple Vitamin (MULTIVITAMIN) tablet Take 1 tablet by mouth daily.     relugolix  (ORGOVYX ) 120 MG tablet Take 1 tablet (120 mg total) by mouth daily. 30 tablet 3   tadalafil  (CIALIS ) 5 MG tablet Take by mouth.     tamsulosin  (FLOMAX ) 0.4 MG CAPS capsule  Take 1 capsule (0.4 mg total) by mouth daily after supper. 30 capsule 12   No current facility-administered medications for this visit.    VITAL SIGNS: BP 138/76 (BP Location: Right Arm, Patient Position: Sitting, Cuff Size: Normal)   Pulse 73   Temp (!) 97.2 F (36.2 C) (Tympanic)   Resp 16   Wt 57.2 kg (126 lb)   SpO2 100%   BMI 19.16 kg/m  Filed Weights   12/17/23 1128  Weight: 57.2 kg (126 lb)    Estimated body mass index is 19.16 kg/m as calculated from the following:   Height as of 11/19/23: 5' 8 (1.727 m).   Weight as of this encounter: 57.2 kg (126 lb).  LABS: CBC:    Component Value Date/Time   WBC 5.0 12/17/2023 1114   WBC 5.3 11/19/2023 1245   HGB 13.1 12/17/2023 1114   HGB 13.7 09/20/2011 1146   HCT 36.2 (L) 12/17/2023 1114   PLT 237 12/17/2023 1114   MCV 89.6 12/17/2023 1114   NEUTROABS 3.1 12/17/2023 1114   LYMPHSABS 1.3 12/17/2023 1114   MONOABS 0.5 12/17/2023 1114   EOSABS 0.1 12/17/2023 1114   BASOSABS 0.0 12/17/2023 1114   Comprehensive Metabolic Panel:    Component Value Date/Time   NA 128 (L) 12/17/2023 1114   K 4.2 12/17/2023 1114   K 4.0 09/20/2011 1146   CL 96 (L) 12/17/2023 1114   CO2 23 12/17/2023 1114   BUN 11 12/17/2023 1114   CREATININE 0.84  12/17/2023 1114   GLUCOSE 101 (H) 12/17/2023 1114   CALCIUM 9.4 12/17/2023 1114   AST 20 12/17/2023 1114   ALT 13 12/17/2023 1114   ALKPHOS 67 12/17/2023 1114   BILITOT 0.7 12/17/2023 1114   PROT 6.7 12/17/2023 1114   ALBUMIN 4.2 12/17/2023 1114     Present during today's visit: patient only  Assessment and Plan: CBC/CMP/PSA reviewed, continue apalutamide  240 mg daily and relugolix  120mg  daily Overall patient is tolerating treatment well  Oral Chemotherapy Adherence: No missed doses reported No patient barriers to medication adherence identified.   New medications: None reported  Medication Access Issues: No issues patient fills at [facility] (Specialty), he was given  their phone number to call them today to set-up shipment, patient reported issues with his home phone so the pharmacy had been unable to reach him  Patient expressed understanding and was in agreement with this plan. He also understands that He can call clinic at any time with any questions, concerns, or complaints.   Follow-up plan: RTC as scheduled  Thank you for allowing me to participate in the care of this very pleasant patient.   Time Total: 10 mins  Visit consisted of counseling and education on dealing with issues of symptom management in the setting of serious and potentially life-threatening illness.Greater than 50%  of this time was spent counseling and coordinating care related to the above assessment and plan.  Signed by: Menaal Russum N. Shameek Nyquist, PharmD, NEILA, CPP Hematology/Oncology Clinical Pharmacist Practitioner [facility] [phone]  12/17/2023 4:34 PM

## 2023-12-31 ENCOUNTER — Other Ambulatory Visit: Payer: Self-pay | Admitting: *Deleted

## 2023-12-31 DIAGNOSIS — R972 Elevated prostate specific antigen [PSA]: Secondary | ICD-10-CM

## 2024-01-08 ENCOUNTER — Other Ambulatory Visit

## 2024-01-09 ENCOUNTER — Other Ambulatory Visit: Payer: Self-pay

## 2024-01-09 NOTE — Progress Notes (Signed)
 Specialty Pharmacy Refill Coordination Note  RICHAD RAMSAY is a 73 y.o. male contacted today regarding refills of specialty medication(s) Apalutamide  (ERLEADA ); Relugolix  (Orgovyx )   Patient requested Delivery   Delivery date: 01/13/24   Verified address: [address] [place] [address]   Medication will be filled on 01/10/24.

## 2024-01-10 ENCOUNTER — Other Ambulatory Visit: Payer: Self-pay

## 2024-01-14 ENCOUNTER — Inpatient Hospital Stay: Admitting: Pharmacist

## 2024-01-14 ENCOUNTER — Inpatient Hospital Stay (HOSPITAL_BASED_OUTPATIENT_CLINIC_OR_DEPARTMENT_OTHER): Admitting: Oncology

## 2024-01-14 ENCOUNTER — Other Ambulatory Visit: Payer: Self-pay

## 2024-01-14 ENCOUNTER — Inpatient Hospital Stay: Attending: Oncology

## 2024-01-14 ENCOUNTER — Encounter: Payer: Self-pay | Admitting: Oncology

## 2024-01-14 VITALS — BP 139/68 | HR 66 | Temp 97.8°F | Resp 18 | Ht 68.0 in | Wt 123.0 lb

## 2024-01-14 DIAGNOSIS — R9721 Rising PSA following treatment for malignant neoplasm of prostate: Secondary | ICD-10-CM | POA: Diagnosis not present

## 2024-01-14 DIAGNOSIS — C61 Malignant neoplasm of prostate: Secondary | ICD-10-CM

## 2024-01-14 DIAGNOSIS — E871 Hypo-osmolality and hyponatremia: Secondary | ICD-10-CM | POA: Insufficient documentation

## 2024-01-14 DIAGNOSIS — F17210 Nicotine dependence, cigarettes, uncomplicated: Secondary | ICD-10-CM | POA: Insufficient documentation

## 2024-01-14 DIAGNOSIS — C7989 Secondary malignant neoplasm of other specified sites: Secondary | ICD-10-CM

## 2024-01-14 DIAGNOSIS — Z923 Personal history of irradiation: Secondary | ICD-10-CM | POA: Insufficient documentation

## 2024-01-14 LAB — CMP (CANCER CENTER ONLY)
ALT: 15 U/L (ref 0–44)
AST: 21 U/L (ref 15–41)
Albumin: 4.1 g/dL (ref 3.5–5.0)
Alkaline Phosphatase: 76 U/L (ref 38–126)
Anion gap: 9 (ref 5–15)
BUN: 12 mg/dL (ref 8–23)
CO2: 24 mmol/L (ref 22–32)
Calcium: 9.1 mg/dL (ref 8.9–10.3)
Chloride: 90 mmol/L — ABNORMAL LOW (ref 98–111)
Creatinine: 0.88 mg/dL (ref 0.61–1.24)
GFR, Estimated: >60 mL/min (ref >60)
Glucose, Bld: 112 mg/dL — ABNORMAL HIGH (ref 70–99)
Potassium: 3.9 mmol/L (ref 3.5–5.1)
Sodium: 123 mmol/L — ABNORMAL LOW (ref 135–145)
Total Bilirubin: 0.9 mg/dL (ref 0.0–1.2)
Total Protein: 6.8 g/dL (ref 6.5–8.1)

## 2024-01-14 LAB — CBC WITH DIFFERENTIAL (CANCER CENTER ONLY)
Abs Immature Granulocytes: 0.02 K/uL (ref 0.00–0.07)
Basophils Absolute: 0 K/uL (ref 0.0–0.1)
Basophils Relative: 1 %
Eosinophils Absolute: 0.1 K/uL (ref 0.0–0.5)
Eosinophils Relative: 2 %
HCT: 36 % — ABNORMAL LOW (ref 39.0–52.0)
Hemoglobin: 12.9 g/dL — ABNORMAL LOW (ref 13.0–17.0)
Immature Granulocytes: 1 %
Lymphocytes Relative: 36 %
Lymphs Abs: 1.4 K/uL (ref 0.7–4.0)
MCH: 31.9 pg (ref 26.0–34.0)
MCHC: 35.8 g/dL (ref 30.0–36.0)
MCV: 88.9 fL (ref 80.0–100.0)
Monocytes Absolute: 0.4 K/uL (ref 0.1–1.0)
Monocytes Relative: 10 %
Neutro Abs: 2.1 K/uL (ref 1.7–7.7)
Neutrophils Relative %: 50 %
Platelet Count: 334 K/uL (ref 150–400)
RBC: 4.05 MIL/uL — ABNORMAL LOW (ref 4.22–5.81)
RDW: 13.3 % (ref 11.5–15.5)
WBC Count: 4 K/uL (ref 4.0–10.5)
nRBC: 0 % (ref 0.0–0.2)

## 2024-01-14 LAB — PSA: Prostatic Specific Antigen: <0.02 ng/mL (ref 0.00–4.00)

## 2024-01-14 NOTE — Progress Notes (Signed)
Patient not seen by CPP today

## 2024-01-14 NOTE — Progress Notes (Signed)
 [facility]  [phone] [phone]  ID: Mario Hernandez OB: Sep 06, 1950  MR#: [id number]  RDW#:[id number]  Patient Care Team: Sadie Manna, MD as PCP - General (Internal Medicine) Jacobo Evalene PARAS, MD as Consulting Physician (Oncology)  CHIEF COMPLAINT: Stage IV prostate cancer, Gleason 4+3.  INTERVAL HISTORY: Patient returns to clinic today for repeat laboratory work, further evaluation, and continuation of Orgovyx  and Erleada .  He has noticed increased fatigue that does not affect his day-to-day activity, but otherwise is tolerating treatments well.  He has no neurologic complaints.  He denies any recent fevers or illnesses.  He has a good appetite and denies weight loss.  He has no chest pain, shortness of breath, cough, or hemoptysis.  He denies any nausea, vomiting, constipation, or diarrhea.  He has no urinary complaints.  Patient offers no further specific complaints today.  REVIEW OF SYSTEMS:   Review of Systems  Constitutional:  Positive for malaise/fatigue. Negative for fever and weight loss.  Respiratory: Negative.  Negative for cough, hemoptysis and shortness of breath.   Cardiovascular: Negative.  Negative for chest pain and leg swelling.  Gastrointestinal: Negative.  Negative for abdominal pain.  Genitourinary: Negative.  Negative for dysuria.  Musculoskeletal: Negative.  Negative for back pain.  Skin: Negative.  Negative for rash.  Neurological: Negative.  Negative for dizziness, focal weakness and headaches.  Psychiatric/Behavioral: Negative.  The patient is not nervous/anxious.     As per HPI. Otherwise, a complete review of systems is negative.  PAST MEDICAL HISTORY: Past Medical History:  Diagnosis Date   BP (high blood pressure) 05/10/2015   Current tobacco use 05/10/2015   Elevated PSA    Helicobacter pylori gastritis    Hypertension    Malignant neoplastic disease (HCC) 05/10/2015   Prostate cancer (HCC)     Urinary urgency     PAST SURGICAL HISTORY: Past Surgical History:  Procedure Laterality Date   GREEN LIGHT LASER TURP (TRANSURETHRAL RESECTION OF PROSTATE  09/22/2011   TONSILLECTOMY      FAMILY HISTORY: Family History  Problem Relation Age of Onset   Coronary artery disease Father    Kidney Stones Brother    Diabetes Brother    Hypertension Brother    Bladder Cancer Neg Hx    Prostate cancer Neg Hx    Kidney cancer Neg Hx     ADVANCED DIRECTIVES (Y/N):  N  HEALTH MAINTENANCE: Social History   Tobacco Use   Smoking status: Every Day    Current packs/day: 1.00    Average packs/day: 1 pack/day for 50.0 years (50.0 ttl pk-yrs)    Types: Cigarettes   Smokeless tobacco: Never  Substance Use Topics   Alcohol  use: Yes    Alcohol /week: 0.0 standard drinks of alcohol    Drug use: Not Currently     Colonoscopy:  PAP:  Bone density:  Lipid panel:  Allergies  Allergen Reactions   Penicillins     Other reaction(s): Unknown    Current Outpatient Medications  Medication Sig Dispense Refill   apalutamide  (ERLEADA ) 240 MG tablet Take 1 tablet (240 mg total) by mouth daily. 30 tablet 3   gabapentin (NEURONTIN) 100 MG capsule Take by mouth.     losartan-hydrochlorothiazide (HYZAAR) 50-12.5 MG tablet Take 1 tablet by mouth daily.     meloxicam (MOBIC) 7.5 MG tablet Take 7.5 mg by mouth daily.     Multiple Vitamin (MULTIVITAMIN) tablet Take 1 tablet by mouth daily.     relugolix  (ORGOVYX ) 120 MG tablet  Take 1 tablet (120 mg total) by mouth daily. 30 tablet 3   tadalafil  (CIALIS ) 5 MG tablet Take by mouth.     tamsulosin  (FLOMAX ) 0.4 MG CAPS capsule Take 1 capsule (0.4 mg total) by mouth daily after supper. 30 capsule 12   No current facility-administered medications for this visit.    OBJECTIVE: Vitals:   01/14/24 1058  BP: 139/68  Pulse: 66  Resp: 18  Temp: 97.8 F (36.6 C)  SpO2: 100%     Body mass index is 18.7 kg/m.    ECOG FS:0 - Asymptomatic  General:  Well-developed, well-nourished, no acute distress. Eyes: Pink conjunctiva, anicteric sclera. HEENT: Normocephalic, moist mucous membranes. Lungs: No audible wheezing or coughing. Heart: Regular rate and rhythm. Abdomen: Soft, nontender, no obvious distention. Musculoskeletal: No edema, cyanosis, or clubbing. Neuro: Alert, answering all questions appropriately. Cranial nerves grossly intact. Skin: No rashes or petechiae noted. Psych: Normal affect.  LAB RESULTS:  Lab Results  Component Value Date   NA 123 (L) 01/14/2024   K 3.9 01/14/2024   CL 90 (L) 01/14/2024   CO2 24 01/14/2024   GLUCOSE 112 (H) 01/14/2024   BUN 12 01/14/2024   CREATININE 0.88 01/14/2024   CALCIUM 9.1 01/14/2024   PROT 6.8 01/14/2024   ALBUMIN 4.1 01/14/2024   AST 21 01/14/2024   ALT 15 01/14/2024   ALKPHOS 76 01/14/2024   BILITOT 0.9 01/14/2024   GFRNONAA >60 01/14/2024    Lab Results  Component Value Date   WBC 4.0 01/14/2024   NEUTROABS 2.1 01/14/2024   HGB 12.9 (L) 01/14/2024   HCT 36.0 (L) 01/14/2024   MCV 88.9 01/14/2024   PLT 334 01/14/2024     STUDIES: No results found.  ASSESSMENT: Stage IV prostate cancer, Gleason 4+3.  PLAN:    Stage IV prostate cancer, Gleason 4+3: initially diagnosed in 2017 and underwent IMRT.  He did not receive adjuvant ADT at that time.  Recently his PSA increased to 32.13 suspicious for recurrence.  PSMA PET scan on Aug 27, 2023 was positive for multiple areas of uptake in the pelvic, periaortic, retroperitoneal, and retrocrural space consistent with nodal metastatic disease.  There was no evidence of solid organ or bony involvement.  He was started on Orgovyx  and Erleada  in May 2025 and is tolerating treatment well.  Patient had previous adverse reaction to Firmagon injections.  His most recent PSA was reported as less than 0.02.  No further intervention is needed.  Continue treatment as prescribed.  Return to clinic in 4 weeks for laboratory work and evaluation  by clinical pharmacy.  Patient will then return to clinic in 8 weeks for laboratory work, further evaluation, and continuation of treatment. Hyponatremia: Patient's sodium level has dropped to 123, monitor.   I spent a total of 30 minutes reviewing chart data, face-to-face evaluation with the patient, counseling and coordination of care as detailed above.    Patient expressed understanding and was in agreement with this plan. He also understands that He can call clinic at any time with any questions, concerns, or complaints.    Cancer Staging  Prostate cancer [facility]) Staging form: Prostate, AJCC 7th Edition - Clinical: Stage IV (TX, N1, M1a, PSA: 20 or greater, Gleason 7) - Signed by Jacobo Evalene PARAS, MD on 11/20/2023 Stage prefix: Initial diagnosis   Evalene PARAS Jacobo, MD   01/14/2024 11:34 AM

## 2024-01-15 ENCOUNTER — Ambulatory Visit: Admitting: Urology

## 2024-01-15 ENCOUNTER — Encounter: Payer: Self-pay | Admitting: Urology

## 2024-01-15 VITALS — BP 143/80 | HR 75 | Ht 68.0 in | Wt 123.0 lb

## 2024-01-15 DIAGNOSIS — I10 Essential (primary) hypertension: Secondary | ICD-10-CM | POA: Diagnosis not present

## 2024-01-15 DIAGNOSIS — C61 Malignant neoplasm of prostate: Secondary | ICD-10-CM

## 2024-01-15 DIAGNOSIS — F17200 Nicotine dependence, unspecified, uncomplicated: Secondary | ICD-10-CM | POA: Diagnosis not present

## 2024-01-15 DIAGNOSIS — R972 Elevated prostate specific antigen [PSA]: Secondary | ICD-10-CM | POA: Diagnosis not present

## 2024-01-15 DIAGNOSIS — M25512 Pain in left shoulder: Secondary | ICD-10-CM | POA: Diagnosis not present

## 2024-01-15 DIAGNOSIS — D649 Anemia, unspecified: Secondary | ICD-10-CM | POA: Diagnosis not present

## 2024-01-15 DIAGNOSIS — R7309 Other abnormal glucose: Secondary | ICD-10-CM | POA: Diagnosis not present

## 2024-01-15 DIAGNOSIS — M25551 Pain in right hip: Secondary | ICD-10-CM | POA: Diagnosis not present

## 2024-01-15 DIAGNOSIS — I700 Atherosclerosis of aorta: Secondary | ICD-10-CM | POA: Diagnosis not present

## 2024-01-15 DIAGNOSIS — R350 Frequency of micturition: Secondary | ICD-10-CM | POA: Diagnosis not present

## 2024-01-15 DIAGNOSIS — J432 Centrilobular emphysema: Secondary | ICD-10-CM | POA: Diagnosis not present

## 2024-01-15 DIAGNOSIS — Z8546 Personal history of malignant neoplasm of prostate: Secondary | ICD-10-CM | POA: Diagnosis not present

## 2024-01-15 DIAGNOSIS — G8929 Other chronic pain: Secondary | ICD-10-CM | POA: Diagnosis not present

## 2024-01-15 LAB — BLADDER SCAN AMB NON-IMAGING

## 2024-01-15 MED ORDER — MIRABEGRON ER 50 MG PO TB24: 50.0000 mg | ORAL_TABLET | Freq: Every day | ORAL | 11 refills | Status: DC

## 2024-01-15 NOTE — Progress Notes (Signed)
   01/15/2024 10:16 AM   Mario Hernandez [birth year] [id number]  Reason for visit: Follow up metastatic castrate sensitive prostate cancer, urinary symptoms  History: Previously managed by Dr. Penne, September 2025 my initial visit with him Intermediate risk prostate cancer treated with radiation in 2017(did not receive ADT at that time) PSA increased up to 32 in March 2025, PSMA PET scan May 2025 showed recurrence in the prostate, and lymph nodes in the pelvis, retroperitoneum, retrocrural space consistent with metastatic disease, no visceral or skeletal metastatic disease Currently managed by oncology with ADT(Orgovyx ), and Erleada  Worsening urinary symptoms over the last few years with frequency, urgency, urge incontinence  Physical Exam: BP (!) 143/80 (BP Location: Left Arm, Patient Position: Standing, Cuff Size: Normal)   Pulse 75   Ht 5' 8 (1.727 m)   Wt 123 lb (55.8 kg)   SpO2 99%   BMI 18.70 kg/m    Imaging/labs: PSA 01/14/2024 undetectable  Today: Continues to report bothersome urinary symptoms with frequency, urgency, urge incontinence, consumes 4-5 beers per day PVR today normal at 27ml Denies any dysuria or gross hematuria He is interested in adding medicine to Flomax  to help with his urgency and urge incontinence  Plan:   Metastatic prostate cancer: Continue Orgovyx  and Erleada , through cancer center Urinary symptoms: Behavioral strategies discussed, continue Flomax , will add Myrbetriq  50 mg daily, risks and benefits discussed RTC 6 weeks PVR and symptom check, consider cystoscopy in the future if persistent symptoms to evaluate from stricture from prior radiation  I spent 30 total minutes on the day of the encounter including pre-visit review of the medical record, face-to-face time with the patient, and post visit ordering of labs/imaging/tests.  Extensive review of prior urologic and oncologic records.    Redell JAYSON Burnet, MD  [facility] [address] [place], [place] [address] [phone]

## 2024-01-23 DIAGNOSIS — Z0000 Encounter for general adult medical examination without abnormal findings: Secondary | ICD-10-CM | POA: Diagnosis not present

## 2024-01-23 DIAGNOSIS — I700 Atherosclerosis of aorta: Secondary | ICD-10-CM | POA: Diagnosis not present

## 2024-01-23 DIAGNOSIS — F17210 Nicotine dependence, cigarettes, uncomplicated: Secondary | ICD-10-CM | POA: Diagnosis not present

## 2024-01-23 DIAGNOSIS — I10 Essential (primary) hypertension: Secondary | ICD-10-CM | POA: Diagnosis not present

## 2024-01-23 DIAGNOSIS — R634 Abnormal weight loss: Secondary | ICD-10-CM | POA: Diagnosis not present

## 2024-01-23 DIAGNOSIS — F17200 Nicotine dependence, unspecified, uncomplicated: Secondary | ICD-10-CM | POA: Diagnosis not present

## 2024-01-23 DIAGNOSIS — M25551 Pain in right hip: Secondary | ICD-10-CM | POA: Diagnosis not present

## 2024-01-23 DIAGNOSIS — R7309 Other abnormal glucose: Secondary | ICD-10-CM | POA: Diagnosis not present

## 2024-01-23 DIAGNOSIS — Z1331 Encounter for screening for depression: Secondary | ICD-10-CM | POA: Diagnosis not present

## 2024-01-23 DIAGNOSIS — R790 Abnormal level of blood mineral: Secondary | ICD-10-CM | POA: Diagnosis not present

## 2024-01-23 DIAGNOSIS — G8929 Other chronic pain: Secondary | ICD-10-CM | POA: Diagnosis not present

## 2024-01-23 DIAGNOSIS — C61 Malignant neoplasm of prostate: Secondary | ICD-10-CM | POA: Diagnosis not present

## 2024-01-23 DIAGNOSIS — D649 Anemia, unspecified: Secondary | ICD-10-CM | POA: Diagnosis not present

## 2024-01-27 ENCOUNTER — Other Ambulatory Visit: Payer: Self-pay

## 2024-01-27 NOTE — Progress Notes (Signed)
 Specialty Pharmacy Ongoing Clinical Assessment Note  Mario Hernandez is a 73 y.o. male who is being followed by the specialty pharmacy service for RxSp Oncology   Patient's specialty medication(s) reviewed today: Apalutamide  (ERLEADA ); Relugolix  (Orgovyx )   Missed doses in the last 4 weeks: 0   Patient/Caregiver did not have any additional questions or concerns.   Therapeutic benefit summary: Patient is achieving benefit   Adverse events/side effects summary: Experienced adverse events/side effects (continued fatigue)   Patient's therapy is appropriate to: Continue    Goals Addressed             This Visit's Progress    Slow Disease Progression       Patient is on track. Patient will maintain adherence. As of 01/14/24 PSA < 0.02.          Follow up: 3 months  Shrinika Blatz M Lovetta Condie Specialty Pharmacist

## 2024-02-05 ENCOUNTER — Other Ambulatory Visit: Payer: Self-pay

## 2024-02-05 DIAGNOSIS — F17210 Nicotine dependence, cigarettes, uncomplicated: Secondary | ICD-10-CM | POA: Diagnosis not present

## 2024-02-05 DIAGNOSIS — R790 Abnormal level of blood mineral: Secondary | ICD-10-CM | POA: Diagnosis not present

## 2024-02-05 DIAGNOSIS — G8929 Other chronic pain: Secondary | ICD-10-CM | POA: Diagnosis not present

## 2024-02-05 DIAGNOSIS — Z0000 Encounter for general adult medical examination without abnormal findings: Secondary | ICD-10-CM | POA: Diagnosis not present

## 2024-02-05 DIAGNOSIS — Z1331 Encounter for screening for depression: Secondary | ICD-10-CM | POA: Diagnosis not present

## 2024-02-05 DIAGNOSIS — M25551 Pain in right hip: Secondary | ICD-10-CM | POA: Diagnosis not present

## 2024-02-05 NOTE — Progress Notes (Signed)
 Specialty Pharmacy Refill Coordination Note  Mario Hernandez is a 73 y.o. male contacted today regarding refills of specialty medication(s) Apalutamide  (ERLEADA ); Relugolix  (Orgovyx )   Patient requested Delivery   Delivery date: 02/11/24   Verified address: [address] [place] [address]   Medication will be filled on 02/10/24.

## 2024-02-10 ENCOUNTER — Other Ambulatory Visit: Payer: Self-pay

## 2024-02-12 ENCOUNTER — Other Ambulatory Visit: Payer: Self-pay | Admitting: *Deleted

## 2024-02-12 DIAGNOSIS — C61 Malignant neoplasm of prostate: Secondary | ICD-10-CM

## 2024-02-12 NOTE — Progress Notes (Signed)
 Clinical Pharmacist Practitioner Clinic [facility]  [phone] Fax:([phone]  Patient Care Team: Sadie Manna, MD as PCP - General (Internal Medicine) Jacobo Evalene PARAS, MD as Consulting Physician (Oncology)   Name of the patient: Mario Hernandez  [id number]  09-08-1950   Date of visit: 02/13/24  HPI: Patient is a 73 y.o. male with stage IV prostate cancer. Currently being treated with apalutamide  and relugolix  which was started in 08/2023.   Reason for Consult: Oral chemotherapy follow-up for apalutamide  and relugolix  therapy.   PAST MEDICAL HISTORY: Past Medical History:  Diagnosis Date   BP (high blood pressure) 05/10/2015   Current tobacco use 05/10/2015   Elevated PSA    Helicobacter pylori gastritis    Hypertension    Malignant neoplastic disease (HCC) 05/10/2015   Prostate cancer [facility])    Urinary urgency     HEMATOLOGY/ONCOLOGY HISTORY:  Oncology History  Prostate cancer (HCC)  05/24/2015 Initial Diagnosis   Prostate cancer (HCC)   11/20/2023 Cancer Staging   Staging form: Prostate, AJCC 7th Edition - Clinical: Stage IV (TX, N1, M1a, PSA: 20 or greater, Gleason 7) - Signed by Jacobo Evalene PARAS, MD on 11/20/2023 Stage prefix: Initial diagnosis     ALLERGIES:  is allergic to penicillins.  MEDICATIONS:  Current Outpatient Medications  Medication Sig Dispense Refill   apalutamide  (ERLEADA ) 240 MG tablet Take 1 tablet (240 mg total) by mouth daily. 30 tablet 3   losartan-hydrochlorothiazide (HYZAAR) 50-12.5 MG tablet Take 1 tablet by mouth daily.     meloxicam (MOBIC) 7.5 MG tablet Take 7.5 mg by mouth daily.     mirabegron  ER (MYRBETRIQ ) 50 MG TB24 tablet Take 1 tablet (50 mg total) by mouth daily. 30 tablet 11   Multiple Vitamin (MULTIVITAMIN) tablet Take 1 tablet by mouth daily.     relugolix  (ORGOVYX ) 120 MG tablet Take 1 tablet (120 mg total) by mouth daily. 30 tablet 3   tadalafil  (CIALIS ) 5 MG tablet Take by mouth.      tamsulosin  (FLOMAX ) 0.4 MG CAPS capsule Take 1 capsule (0.4 mg total) by mouth daily after supper. 30 capsule 12   No current facility-administered medications for this visit.    VITAL SIGNS: BP 130/75   Wt 55.8 kg (123 lb)   BMI 18.70 kg/m  Filed Weights   02/13/24 1135  Weight: 55.8 kg (123 lb)     Estimated body mass index is 18.7 kg/m as calculated from the following:   Height as of 01/15/24: 5' 8 (1.727 m).   Weight as of this encounter: 55.8 kg (123 lb).  LABS: CBC:    Component Value Date/Time   WBC 5.1 02/13/2024 1057   HGB 12.7 (L) 02/13/2024 1057   HGB 12.9 (L) 01/14/2024 1040   HGB 13.7 09/20/2011 1146   HCT 36.0 (L) 02/13/2024 1057   PLT 313 02/13/2024 1057   PLT 334 01/14/2024 1040   MCV 91.4 02/13/2024 1057   NEUTROABS 3.0 02/13/2024 1057   LYMPHSABS 1.5 02/13/2024 1057   MONOABS 0.5 02/13/2024 1057   EOSABS 0.1 02/13/2024 1057   BASOSABS 0.0 02/13/2024 1057   Comprehensive Metabolic Panel:    Component Value Date/Time   NA 126 (L) 02/13/2024 1057   K 3.8 02/13/2024 1057   K 4.0 09/20/2011 1146   CL 92 (L) 02/13/2024 1057   CO2 24 02/13/2024 1057   BUN 12 02/13/2024 1057   CREATININE 0.98 02/13/2024 1057   GLUCOSE 102 (H) 02/13/2024 1057   CALCIUM 9.3  02/13/2024 1057   AST 21 02/13/2024 1057   ALT 11 02/13/2024 1057   ALKPHOS 75 02/13/2024 1057   BILITOT 0.7 02/13/2024 1057   PROT 7.0 02/13/2024 1057   ALBUMIN 4.1 02/13/2024 1057     Present during today's visit: patient only  Assessment and Plan: CBC/CMP/PSA reviewed, continue apalutamide  240 mg daily and relugolix  120mg  daily PSA was undetectable at last office visit, today still pending Overall patient is tolerating treatment, he has occasional fatigue when he does a lot of work around the house but seems to recover with rest  Oral Chemotherapy Adherence: No missed doses reported No patient barriers to medication adherence identified.   New medications: None  reported  Medication Access Issues: No issues patient fills at [facility] he was given their phone number to call them today to set-up shipment, patient reported issues with his home phone so the pharmacy had been unable to reach him  Patient expressed understanding and was in agreement with this plan. He also understands that He can call clinic at any time with any questions, concerns, or complaints.   Follow-up plan: RTC as scheduled  Thank you for allowing me to participate in the care of this very pleasant patient.   Time Total: 10 mins  Visit consisted of counseling and education on dealing with issues of symptom management in the setting of serious and potentially life-threatening illness.Greater than 50%  of this time was spent counseling and coordinating care related to the above assessment and plan.  Signed by: Tashika Goodin N. Joffre Lucks, PharmD, NEILA, CPP Hematology/Oncology Clinical Pharmacist Practitioner [facility] [phone]  02/13/2024 2:55 PM

## 2024-02-13 ENCOUNTER — Inpatient Hospital Stay: Attending: Oncology

## 2024-02-13 ENCOUNTER — Other Ambulatory Visit (HOSPITAL_COMMUNITY): Payer: Self-pay

## 2024-02-13 ENCOUNTER — Other Ambulatory Visit: Payer: Self-pay

## 2024-02-13 ENCOUNTER — Inpatient Hospital Stay: Admitting: Pharmacist

## 2024-02-13 VITALS — BP 130/75 | Wt 123.0 lb

## 2024-02-13 DIAGNOSIS — C61 Malignant neoplasm of prostate: Secondary | ICD-10-CM | POA: Insufficient documentation

## 2024-02-13 LAB — CMP (CANCER CENTER ONLY)
ALT: 11 U/L (ref 0–44)
AST: 21 U/L (ref 15–41)
Albumin: 4.1 g/dL (ref 3.5–5.0)
Alkaline Phosphatase: 75 U/L (ref 38–126)
Anion gap: 10 (ref 5–15)
BUN: 12 mg/dL (ref 8–23)
CO2: 24 mmol/L (ref 22–32)
Calcium: 9.3 mg/dL (ref 8.9–10.3)
Chloride: 92 mmol/L — ABNORMAL LOW (ref 98–111)
Creatinine: 0.98 mg/dL (ref 0.61–1.24)
GFR, Estimated: >60 mL/min (ref >60)
Glucose, Bld: 102 mg/dL — ABNORMAL HIGH (ref 70–99)
Potassium: 3.8 mmol/L (ref 3.5–5.1)
Sodium: 126 mmol/L — ABNORMAL LOW (ref 135–145)
Total Bilirubin: 0.7 mg/dL (ref 0.0–1.2)
Total Protein: 7 g/dL (ref 6.5–8.1)

## 2024-02-13 LAB — CBC WITH DIFFERENTIAL/PLATELET
Abs Immature Granulocytes: 0.02 K/uL (ref 0.00–0.07)
Basophils Absolute: 0 K/uL (ref 0.0–0.1)
Basophils Relative: 1 %
Eosinophils Absolute: 0.1 K/uL (ref 0.0–0.5)
Eosinophils Relative: 2 %
HCT: 36 % — ABNORMAL LOW (ref 39.0–52.0)
Hemoglobin: 12.7 g/dL — ABNORMAL LOW (ref 13.0–17.0)
Immature Granulocytes: 0 %
Lymphocytes Relative: 29 %
Lymphs Abs: 1.5 K/uL (ref 0.7–4.0)
MCH: 32.2 pg (ref 26.0–34.0)
MCHC: 35.3 g/dL (ref 30.0–36.0)
MCV: 91.4 fL (ref 80.0–100.0)
Monocytes Absolute: 0.5 K/uL (ref 0.1–1.0)
Monocytes Relative: 9 %
Neutro Abs: 3 K/uL (ref 1.7–7.7)
Neutrophils Relative %: 59 %
Platelets: 313 K/uL (ref 150–400)
RBC: 3.94 MIL/uL — ABNORMAL LOW (ref 4.22–5.81)
RDW: 12.6 % (ref 11.5–15.5)
WBC: 5.1 K/uL (ref 4.0–10.5)
nRBC: 0 % (ref 0.0–0.2)

## 2024-02-13 LAB — PSA: Prostatic Specific Antigen: <0.02 ng/mL (ref 0.00–4.00)

## 2024-02-13 MED ORDER — APALUTAMIDE 240 MG PO TABS
240.0000 mg | ORAL_TABLET | Freq: Every day | ORAL | 3 refills | Status: AC
  Filled 2024-02-13 – 2024-03-09 (×2): qty 30, 30d supply, fill #0
  Filled 2024-04-03: qty 30, 30d supply, fill #1
  Filled 2024-05-04 – 2024-05-08 (×2): qty 30, 30d supply, fill #2

## 2024-02-13 MED ORDER — ORGOVYX 120 MG PO TABS
120.0000 mg | ORAL_TABLET | Freq: Every day | ORAL | 3 refills | Status: AC
  Filled 2024-02-13 – 2024-03-09 (×2): qty 30, 30d supply, fill #0
  Filled 2024-04-03: qty 30, 30d supply, fill #1
  Filled 2024-05-04 – 2024-05-08 (×2): qty 30, 30d supply, fill #2

## 2024-02-14 ENCOUNTER — Other Ambulatory Visit: Payer: Self-pay

## 2024-02-26 ENCOUNTER — Ambulatory Visit: Admitting: Physician Assistant

## 2024-02-26 VITALS — BP 166/92 | HR 80 | Ht 68.5 in | Wt 122.0 lb

## 2024-02-26 DIAGNOSIS — R350 Frequency of micturition: Secondary | ICD-10-CM | POA: Diagnosis not present

## 2024-02-26 LAB — BLADDER SCAN AMB NON-IMAGING: Scan Result: 3

## 2024-02-26 MED ORDER — GEMTESA 75 MG PO TABS: 75.0000 mg | ORAL_TABLET | Freq: Every day | ORAL | Status: DC

## 2024-02-26 NOTE — Progress Notes (Signed)
 02/26/2024 2:52 PM   Mario Hernandez 06-15-1950 [id number]  CC: Chief Complaint  Patient presents with   Medical Management of Chronic Issues   HPI: Mario Hernandez is a 73 y.o. male with PMH metastatic castrate sensitive prostate cancer on Orgovyx  and Erleada  and LUTS on Flomax  who presents today for symptom recheck after starting Myrbetriq  50 mg.   Today he reports no significant improvement in urgency, frequency, and urge incontinence on Myrbetriq .  He does notice the urgency can be triggered by flatulence or bowel movements.  He denies constipation, dry mouth, or dry eye at baseline.  PVR 3mL.  PMH: Past Medical History:  Diagnosis Date   BP (high blood pressure) 05/10/2015   Current tobacco use 05/10/2015   Elevated PSA    Helicobacter pylori gastritis    Hypertension    Malignant neoplastic disease (HCC) 05/10/2015   Prostate cancer [facility])    Urinary urgency     Surgical History: Past Surgical History:  Procedure Laterality Date   GREEN LIGHT LASER TURP (TRANSURETHRAL RESECTION OF PROSTATE  09/22/2011   TONSILLECTOMY      Home Medications:  Allergies as of 02/26/2024       Reactions   Penicillins    Other reaction(s): Unknown        Medication List        Accurate as of February 26, 2024  2:52 PM. If you have any questions, ask your nurse or doctor.          apalutamide  240 MG tablet Commonly known as: Erleada  Take 1 tablet (240 mg total) by mouth daily.   gabapentin 100 MG capsule Commonly known as: NEURONTIN Take 100 mg by mouth.   losartan-hydrochlorothiazide 50-12.5 MG tablet Commonly known as: HYZAAR Take 1 tablet by mouth daily.   meloxicam 7.5 MG tablet Commonly known as: MOBIC Take 7.5 mg by mouth daily.   mirabegron  ER 50 MG Tb24 tablet Commonly known as: MYRBETRIQ  Take 1 tablet (50 mg total) by mouth daily.   multivitamin tablet Take 1 tablet by mouth daily.   Orgovyx  120 MG tablet Generic drug: relugolix  Take 1 tablet (120  mg total) by mouth daily.   tadalafil  5 MG tablet Commonly known as: CIALIS  Take by mouth.   tamsulosin  0.4 MG Caps capsule Commonly known as: FLOMAX  Take 1 capsule (0.4 mg total) by mouth daily after supper.        Allergies:  Allergies  Allergen Reactions   Penicillins     Other reaction(s): Unknown    Family History: Family History  Problem Relation Age of Onset   Coronary artery disease Father    Kidney Stones Brother    Diabetes Brother    Hypertension Brother    Bladder Cancer Neg Hx    Prostate cancer Neg Hx    Kidney cancer Neg Hx     Social History:   reports that he has been smoking cigarettes. He has a 50 pack-year smoking history. He has never used smokeless tobacco. He reports current alcohol  use. He reports that he does not currently use drugs.  Physical Exam: BP (!) 166/92   Pulse 80   Ht 5' 8.5 (1.74 m)   Wt 122 lb (55.3 kg)   BMI 18.28 kg/m   Constitutional:  Alert and oriented, no acute distress, nontoxic appearing HEENT: [place], AT Cardiovascular: No clubbing, cyanosis, or edema Respiratory: Normal respiratory effort, no increased work of breathing Skin: No rashes, bruises or suspicious lesions Neurologic: Grossly intact, no  focal deficits, moving all 4 extremities Psychiatric: Normal mood and affect  Laboratory Data: Results for orders placed or performed in visit on 02/26/24  Bladder Scan (Post Void Residual) in office   Collection Time: 02/26/24  2:41 PM  Result Value Ref Range   Scan Result 3    Assessment & Plan:   1. Urinary frequency (Primary) No significant improvement on Myrbetriq , though he is emptying appropriately.  Suspect an element of bowel bladder dysfunction, likely exacerbated due to prior radiation.  Will try Gemtesa as an alternative and see him back for symptom recheck in 6 weeks.  If no better, we will recommend cystoscopy. - Bladder Scan (Post Void Residual) in office - Vibegron (GEMTESA) 75 MG TABS; Take 1 tablet  (75 mg total) by mouth daily.   Return in about 6 weeks (around 04/08/2024) for Symptom recheck with PVR.  Lucie Hones, PA-C  [facility] [place] [address] [place], [place] [address] [phone]

## 2024-03-03 ENCOUNTER — Other Ambulatory Visit: Payer: Self-pay

## 2024-03-05 ENCOUNTER — Telehealth: Payer: Self-pay | Admitting: Physician Assistant

## 2024-03-05 NOTE — Telephone Encounter (Signed)
 Can you please let him know that I placed his additional Gemtesa samples at the front desk for him to pick up at his convenience?

## 2024-03-05 NOTE — Telephone Encounter (Signed)
 1st phone call attempted. Detailed voicemail left to pick up samples at front desk.

## 2024-03-06 NOTE — Telephone Encounter (Signed)
 2nd phone call attempted. No answer.

## 2024-03-06 NOTE — Telephone Encounter (Signed)
 3rd phone call attempted. Detailed voicemail left to pick up samples of Gemtesa at the front desk at his convenience.

## 2024-03-09 ENCOUNTER — Other Ambulatory Visit (HOSPITAL_COMMUNITY): Payer: Self-pay

## 2024-03-09 ENCOUNTER — Other Ambulatory Visit: Payer: Self-pay

## 2024-03-10 ENCOUNTER — Other Ambulatory Visit: Payer: Self-pay

## 2024-03-10 NOTE — Progress Notes (Signed)
 Specialty Pharmacy Refill Coordination Note  Mario Hernandez is a 73 y.o. male contacted today regarding refills of specialty medication(s) Apalutamide  (ERLEADA ); Relugolix  (Orgovyx )   Patient requested Delivery   Delivery date: 03/13/24   Verified address: [address] [place] [address]   Medication will be filled on: 03/12/24

## 2024-03-11 ENCOUNTER — Other Ambulatory Visit: Payer: Self-pay

## 2024-03-12 ENCOUNTER — Other Ambulatory Visit: Payer: Self-pay | Admitting: *Deleted

## 2024-03-12 DIAGNOSIS — C61 Malignant neoplasm of prostate: Secondary | ICD-10-CM

## 2024-03-13 ENCOUNTER — Encounter: Payer: Self-pay | Admitting: Oncology

## 2024-03-13 ENCOUNTER — Inpatient Hospital Stay: Admitting: Oncology

## 2024-03-13 ENCOUNTER — Inpatient Hospital Stay: Attending: Oncology

## 2024-03-13 VITALS — BP 126/80 | HR 71 | Temp 96.4°F | Resp 18 | Ht 68.5 in | Wt 122.5 lb

## 2024-03-13 DIAGNOSIS — Z191 Hormone sensitive malignancy status: Secondary | ICD-10-CM | POA: Diagnosis not present

## 2024-03-13 DIAGNOSIS — R5382 Chronic fatigue, unspecified: Secondary | ICD-10-CM | POA: Diagnosis not present

## 2024-03-13 DIAGNOSIS — C61 Malignant neoplasm of prostate: Secondary | ICD-10-CM | POA: Diagnosis not present

## 2024-03-13 DIAGNOSIS — F17210 Nicotine dependence, cigarettes, uncomplicated: Secondary | ICD-10-CM | POA: Diagnosis not present

## 2024-03-13 DIAGNOSIS — I10 Essential (primary) hypertension: Secondary | ICD-10-CM | POA: Diagnosis not present

## 2024-03-13 DIAGNOSIS — Z8719 Personal history of other diseases of the digestive system: Secondary | ICD-10-CM | POA: Diagnosis not present

## 2024-03-13 DIAGNOSIS — C7989 Secondary malignant neoplasm of other specified sites: Secondary | ICD-10-CM | POA: Diagnosis not present

## 2024-03-13 DIAGNOSIS — Z791 Long term (current) use of non-steroidal anti-inflammatories (NSAID): Secondary | ICD-10-CM | POA: Diagnosis not present

## 2024-03-13 DIAGNOSIS — Z79899 Other long term (current) drug therapy: Secondary | ICD-10-CM | POA: Diagnosis not present

## 2024-03-13 DIAGNOSIS — D649 Anemia, unspecified: Secondary | ICD-10-CM | POA: Diagnosis not present

## 2024-03-13 LAB — PSA: Prostatic Specific Antigen: <0.02 ng/mL (ref 0.00–4.00)

## 2024-03-13 LAB — CMP (CANCER CENTER ONLY)
ALT: 13 U/L (ref 0–44)
AST: 19 U/L (ref 15–41)
Albumin: 4.1 g/dL (ref 3.5–5.0)
Alkaline Phosphatase: 66 U/L (ref 38–126)
Anion gap: 8 (ref 5–15)
BUN: 15 mg/dL (ref 8–23)
CO2: 26 mmol/L (ref 22–32)
Calcium: 9.3 mg/dL (ref 8.9–10.3)
Chloride: 94 mmol/L — ABNORMAL LOW (ref 98–111)
Creatinine: 0.9 mg/dL (ref 0.61–1.24)
GFR, Estimated: >60 mL/min (ref >60)
Glucose, Bld: 99 mg/dL (ref 70–99)
Potassium: 3.7 mmol/L (ref 3.5–5.1)
Sodium: 128 mmol/L — ABNORMAL LOW (ref 135–145)
Total Bilirubin: 0.7 mg/dL (ref 0.0–1.2)
Total Protein: 6.7 g/dL (ref 6.5–8.1)

## 2024-03-13 LAB — CBC WITH DIFFERENTIAL/PLATELET
Abs Immature Granulocytes: 0.02 K/uL (ref 0.00–0.07)
Basophils Absolute: 0 K/uL (ref 0.0–0.1)
Basophils Relative: 1 %
Eosinophils Absolute: 0.1 K/uL (ref 0.0–0.5)
Eosinophils Relative: 3 %
HCT: 36 % — ABNORMAL LOW (ref 39.0–52.0)
Hemoglobin: 12.9 g/dL — ABNORMAL LOW (ref 13.0–17.0)
Immature Granulocytes: 0 %
Lymphocytes Relative: 25 %
Lymphs Abs: 1.1 K/uL (ref 0.7–4.0)
MCH: 32.1 pg (ref 26.0–34.0)
MCHC: 35.8 g/dL (ref 30.0–36.0)
MCV: 89.6 fL (ref 80.0–100.0)
Monocytes Absolute: 0.4 K/uL (ref 0.1–1.0)
Monocytes Relative: 9 %
Neutro Abs: 2.8 K/uL (ref 1.7–7.7)
Neutrophils Relative %: 62 %
Platelets: 241 K/uL (ref 150–400)
RBC: 4.02 MIL/uL — ABNORMAL LOW (ref 4.22–5.81)
RDW: 12.7 % (ref 11.5–15.5)
WBC: 4.5 K/uL (ref 4.0–10.5)
nRBC: 0 % (ref 0.0–0.2)

## 2024-03-13 NOTE — Progress Notes (Signed)
 No concerns today

## 2024-03-13 NOTE — Progress Notes (Signed)
 [facility]  Telephone:([phone] Fax:(336) [phone]  ID: Mario Hernandez OB: Jan 15, 1951  MR#: [id number]  RDW#:[id number]  Patient Care Team: Sadie Manna, MD as PCP - General (Internal Medicine) Jacobo Evalene PARAS, MD as Consulting Physician (Oncology)  CHIEF COMPLAINT: Stage IV prostate cancer, Gleason 4+3.  INTERVAL HISTORY: Patient returns to clinic today for repeat laboratory, further evaluation, and continuation of Orgovyx  and Erleada .  He continues to have chronic fatigue, but this does not affect his day-to-day activity.  He otherwise is tolerating his treatments well.  He has no neurologic complaints.  He denies any recent fevers or illnesses.  He has a good appetite and denies weight loss.  He has no chest pain, shortness of breath, cough, or hemoptysis.  He denies any nausea, vomiting, constipation, or diarrhea.  He has no urinary complaints.  Patient offers no further specific complaints today.  REVIEW OF SYSTEMS:   Review of Systems  Constitutional: Negative.  Negative for fever, malaise/fatigue and weight loss.  Respiratory: Negative.  Negative for cough, hemoptysis and shortness of breath.   Cardiovascular: Negative.  Negative for chest pain and leg swelling.  Gastrointestinal: Negative.  Negative for abdominal pain.  Genitourinary: Negative.  Negative for dysuria.  Musculoskeletal: Negative.  Negative for back pain.  Skin: Negative.  Negative for rash.  Neurological: Negative.  Negative for dizziness, focal weakness and headaches.  Psychiatric/Behavioral: Negative.  The patient is not nervous/anxious.     As per HPI. Otherwise, a complete review of systems is negative.  PAST MEDICAL HISTORY: Past Medical History:  Diagnosis Date   BP (high blood pressure) 05/10/2015   Current tobacco use 05/10/2015   Elevated PSA    Helicobacter pylori gastritis    Hypertension    Malignant neoplastic disease (HCC) 05/10/2015   Prostate cancer (HCC)     Urinary urgency     PAST SURGICAL HISTORY: Past Surgical History:  Procedure Laterality Date   GREEN LIGHT LASER TURP (TRANSURETHRAL RESECTION OF PROSTATE  09/22/2011   TONSILLECTOMY      FAMILY HISTORY: Family History  Problem Relation Age of Onset   Coronary artery disease Father    Kidney Stones Brother    Diabetes Brother    Hypertension Brother    Bladder Cancer Neg Hx    Prostate cancer Neg Hx    Kidney cancer Neg Hx     ADVANCED DIRECTIVES (Y/N):  N  HEALTH MAINTENANCE: Social History   Tobacco Use   Smoking status: Every Day    Current packs/day: 1.00    Average packs/day: 1 pack/day for 50.0 years (50.0 ttl pk-yrs)    Types: Cigarettes   Smokeless tobacco: Never  Substance Use Topics   Alcohol  use: Yes    Alcohol /week: 0.0 standard drinks of alcohol    Drug use: Not Currently     Colonoscopy:  PAP:  Bone density:  Lipid panel:  Allergies  Allergen Reactions   Penicillins     Other reaction(s): Unknown    Current Outpatient Medications  Medication Sig Dispense Refill   apalutamide  (ERLEADA ) 240 MG tablet Take 1 tablet (240 mg total) by mouth daily. 30 tablet 3   gabapentin (NEURONTIN) 100 MG capsule Take 100 mg by mouth.     losartan-hydrochlorothiazide (HYZAAR) 50-12.5 MG tablet Take 1 tablet by mouth daily.     meloxicam (MOBIC) 7.5 MG tablet Take 7.5 mg by mouth daily.     Multiple Vitamin (MULTIVITAMIN) tablet Take 1 tablet by mouth daily.     relugolix  (  ORGOVYX ) 120 MG tablet Take 1 tablet (120 mg total) by mouth daily. 30 tablet 3   tadalafil  (CIALIS ) 5 MG tablet Take by mouth.     tamsulosin  (FLOMAX ) 0.4 MG CAPS capsule Take 1 capsule (0.4 mg total) by mouth daily after supper. 30 capsule 12   Vibegron  (GEMTESA ) 75 MG TABS Take 1 tablet (75 mg total) by mouth daily.     No current facility-administered medications for this visit.    OBJECTIVE: Vitals:   03/13/24 1123  BP: 126/80  Pulse: 71  Resp: 18  Temp: (!) 96.4 F (35.8 C)   SpO2: 100%     Body mass index is 18.36 kg/m.    ECOG FS:0 - Asymptomatic  General: Well-developed, well-nourished, no acute distress. Eyes: Pink conjunctiva, anicteric sclera. HEENT: Normocephalic, moist mucous membranes. Lungs: No audible wheezing or coughing. Heart: Regular rate and rhythm. Abdomen: Soft, nontender, no obvious distention. Musculoskeletal: No edema, cyanosis, or clubbing. Neuro: Alert, answering all questions appropriately. Cranial nerves grossly intact. Skin: No rashes or petechiae noted. Psych: Normal affect.  LAB RESULTS:  Lab Results  Component Value Date   NA 128 (L) 03/13/2024   K 3.7 03/13/2024   CL 94 (L) 03/13/2024   CO2 26 03/13/2024   GLUCOSE 99 03/13/2024   BUN 15 03/13/2024   CREATININE 0.90 03/13/2024   CALCIUM 9.3 03/13/2024   PROT 6.7 03/13/2024   ALBUMIN 4.1 03/13/2024   AST 19 03/13/2024   ALT 13 03/13/2024   ALKPHOS 66 03/13/2024   BILITOT 0.7 03/13/2024   GFRNONAA >60 03/13/2024    Lab Results  Component Value Date   WBC 4.5 03/13/2024   NEUTROABS 2.8 03/13/2024   HGB 12.9 (L) 03/13/2024   HCT 36.0 (L) 03/13/2024   MCV 89.6 03/13/2024   PLT 241 03/13/2024     STUDIES: No results found.  ASSESSMENT: Stage IV prostate cancer, Gleason 4+3.  PLAN:    Stage IV prostate cancer, Gleason 4+3: initially diagnosed in 2017 and underwent IMRT.  He did not receive adjuvant ADT at that time.  Recently his PSA increased to 32.13 suspicious for recurrence.  PSMA PET scan on Aug 27, 2023 was positive for multiple areas of uptake in the pelvic, periaortic, retroperitoneal, and retrocrural space consistent with nodal metastatic disease.  There was no evidence of solid organ or bony involvement.  He was started on Orgovyx  and Erleada  in May 2025 and is tolerating treatment well.  Patient had previous adverse reaction to Firmagon injections.  His PSA continues to be undetectable less than 0.02.  His PSA has been undetectable since November 19, 2023.  Today's result is pending.  Continue treatment as prescribed.  Return to clinic in 2 months for laboratory work and evaluation by clinical pharmacy.  Patient will then return to clinic in 4 months for laboratory work, further evaluation, and continuation of treatment.   Hyponatremia: Patient's sodium level has improved to 128.  Monitor. Anemia: Mild.  Patient's hemoglobin is 12.9.  I spent a total of 30 minutes reviewing chart data, face-to-face evaluation with the patient, counseling and coordination of care as detailed above.   Patient expressed understanding and was in agreement with this plan. He also understands that He can call clinic at any time with any questions, concerns, or complaints.    Cancer Staging  Prostate cancer [facility]) Staging form: Prostate, AJCC 7th Edition - Clinical: Stage IV (TX, N1, M1a, PSA: 20 or greater, Gleason 7) - Signed by Jacobo Evalene PARAS, MD  on 11/20/2023 Stage prefix: Initial diagnosis   Evalene JINNY Reusing, MD   03/13/2024 3:00 PM

## 2024-04-03 ENCOUNTER — Other Ambulatory Visit (HOSPITAL_COMMUNITY): Payer: Self-pay

## 2024-04-03 ENCOUNTER — Other Ambulatory Visit: Payer: Self-pay

## 2024-04-03 NOTE — Progress Notes (Signed)
 Specialty Pharmacy Refill Coordination Note  Mario Hernandez is a 73 y.o. male contacted today regarding refills of specialty medication(s) Apalutamide  (ERLEADA ); Relugolix  (Orgovyx )   Patient requested Delivery   Delivery date: 04/13/24   Verified address: [address] [place] [address]   Medication will be filled on: 04/10/24

## 2024-04-06 ENCOUNTER — Other Ambulatory Visit: Payer: Self-pay

## 2024-04-08 ENCOUNTER — Ambulatory Visit: Admitting: Physician Assistant

## 2024-04-08 VITALS — BP 162/88 | HR 78 | Ht 68.0 in | Wt 122.0 lb

## 2024-04-08 DIAGNOSIS — R350 Frequency of micturition: Secondary | ICD-10-CM | POA: Diagnosis not present

## 2024-04-08 LAB — BLADDER SCAN AMB NON-IMAGING: Scan Result: 0 mL

## 2024-04-08 MED ORDER — GEMTESA 75 MG PO TABS: 75.0000 mg | ORAL_TABLET | Freq: Every day | ORAL | 11 refills | Status: AC

## 2024-04-08 NOTE — Progress Notes (Signed)
 04/08/2024 1:23 PM   Mario Hernandez 08/15/1950 [id number]  CC: Chief Complaint  Patient presents with   Medical Management of Chronic Issues   HPI: Mario Hernandez is a 73 y.o. male with PMH metastatic castrate sensitive prostate cancer on Orgovyx  and Erleada  and LUTS on Flomax  who failed Myrbetriq  50 mg who presents today for symptom recheck on Gemtesa .   Today he reports improved frequency, urgency, and nocturia, now x 2-3, previously x 3+.  He is now dry.  He denies gross hematuria or dysuria.  PVR 0 mL.  PMH: Past Medical History:  Diagnosis Date   BP (high blood pressure) 05/10/2015   Current tobacco use 05/10/2015   Elevated PSA    Helicobacter pylori gastritis    Hypertension    Malignant neoplastic disease (HCC) 05/10/2015   Prostate cancer [facility])    Urinary urgency     Surgical History: Past Surgical History:  Procedure Laterality Date   GREEN LIGHT LASER TURP (TRANSURETHRAL RESECTION OF PROSTATE  09/22/2011   TONSILLECTOMY      Home Medications:  Allergies as of 04/08/2024       Reactions   Penicillins    Other reaction(s): Unknown        Medication List        Accurate as of April 08, 2024  1:23 PM. If you have any questions, ask your nurse or doctor.          Erleada  240 MG tablet Generic drug: apalutamide  Take 1 tablet (240 mg total) by mouth daily.   gabapentin 100 MG capsule Commonly known as: NEURONTIN Take 100 mg by mouth.   Gemtesa  75 MG Tabs Generic drug: Vibegron  Take 1 tablet (75 mg total) by mouth daily.   losartan-hydrochlorothiazide 50-12.5 MG tablet Commonly known as: HYZAAR Take 1 tablet by mouth daily.   meloxicam 7.5 MG tablet Commonly known as: MOBIC Take 7.5 mg by mouth daily.   multivitamin tablet Take 1 tablet by mouth daily.   Orgovyx  120 MG tablet Generic drug: relugolix  Take 1 tablet (120 mg total) by mouth daily.   tadalafil  5 MG tablet Commonly known as: CIALIS  Take by mouth.   tamsulosin  0.4  MG Caps capsule Commonly known as: FLOMAX  Take 1 capsule (0.4 mg total) by mouth daily after supper.        Allergies:  Allergies[1]  Family History: Family History  Problem Relation Age of Onset   Coronary artery disease Father    Kidney Stones Brother    Diabetes Brother    Hypertension Brother    Bladder Cancer Neg Hx    Prostate cancer Neg Hx    Kidney cancer Neg Hx     Social History:   reports that he has been smoking cigarettes. He has a 50 pack-year smoking history. He has never used smokeless tobacco. He reports current alcohol  use. He reports that he does not currently use drugs.  Physical Exam: BP (!) 162/88   Pulse 78   Ht 5' 8 (1.727 m)   Wt 122 lb (55.3 kg)   BMI 18.55 kg/m   Constitutional:  Alert and oriented, no acute distress, nontoxic appearing HEENT: [place], AT Cardiovascular: No clubbing, cyanosis, or edema Respiratory: Normal respiratory effort, no increased work of breathing Skin: No rashes, bruises or suspicious lesions Neurologic: Grossly intact, no focal deficits, moving all 4 extremities Psychiatric: Normal mood and affect  Laboratory Data: Results for orders placed or performed in visit on 04/08/24  Bladder Scan (Post Void  Residual) in office   Collection Time: 04/08/24  1:38 PM  Result Value Ref Range   Scan Result 0 ml   Assessment & Plan:   1. Urinary frequency (Primary) Significant improvement in storage related symptoms and nocturia on Gemtesa , will continue this.  He is emptying appropriately. - Bladder Scan (Post Void Residual) in office - Vibegron  (GEMTESA ) 75 MG TABS; Take 1 tablet (75 mg total) by mouth daily.  Dispense: 30 tablet; Refill: 11   Return in about 6 months (around 10/07/2024) for Prostate cancer follow-up with Dr. Francisca.  Mario Hones, PA-C  [facility] [place] [address], [address] [place], [place] [address] [phone]     [1]  Allergies Allergen Reactions   Penicillins      Other reaction(s): Unknown

## 2024-04-09 ENCOUNTER — Other Ambulatory Visit: Payer: Self-pay

## 2024-04-09 NOTE — Progress Notes (Signed)
**  Completed on 12/12 during refill call**  Specialty Pharmacy Ongoing Clinical Assessment Note  Mario Hernandez is a 73 y.o. male who is being followed by the specialty pharmacy service for RxSp Oncology   Patient's specialty medication(s) reviewed today: Apalutamide  (ERLEADA ); Relugolix  (Orgovyx )   Missed doses in the last 4 weeks: 0   Patient/Caregiver did not have any additional questions or concerns.   Therapeutic benefit summary: Patient is achieving benefit   Adverse events/side effects summary: Experienced adverse events/side effects   Patient's therapy is appropriate to: Continue    Goals Addressed             This Visit's Progress    Slow Disease Progression       Patient is on track. Patient will maintain adherence. As of 03/13/24 PSA < 0.02.          Follow up: 3 months  Ashaunti Treptow M Zahira Brummond Specialty Pharmacist

## 2024-04-10 ENCOUNTER — Other Ambulatory Visit: Payer: Self-pay

## 2024-05-04 ENCOUNTER — Other Ambulatory Visit (HOSPITAL_COMMUNITY): Payer: Self-pay

## 2024-05-06 ENCOUNTER — Other Ambulatory Visit: Payer: Self-pay

## 2024-05-08 ENCOUNTER — Other Ambulatory Visit (HOSPITAL_COMMUNITY): Payer: Self-pay

## 2024-05-08 ENCOUNTER — Other Ambulatory Visit: Payer: Self-pay | Admitting: Pharmacy Technician

## 2024-05-08 ENCOUNTER — Telehealth: Payer: Self-pay | Admitting: Pharmacy Technician

## 2024-05-08 ENCOUNTER — Other Ambulatory Visit: Payer: Self-pay

## 2024-05-08 NOTE — Progress Notes (Signed)
 Specialty Pharmacy Refill Coordination Note  Mario Hernandez is a 74 y.o. male contacted today regarding refills of specialty medication(s) Apalutamide  (ERLEADA ); Relugolix  (Orgovyx )   Patient requested Delivery   Delivery date: 05/12/24   Verified address: [address] [place]   Medication will be filled on: 05/11/24

## 2024-05-08 NOTE — Telephone Encounter (Signed)
 Oral Oncology Patient Advocate Encounter  Was successful in securing patient a grant from [employer] to provide copayment coverage for Erleada  and Orgovyx .  This will keep the out of pocket expense at $0.    I have spoken with the patient.   The billing information is as follows and has been shared with [facility].    RxBin: [id number] PCN: [id number] Member ID: [id number] Group ID: [id number] Dates of Eligibility: 04/23/2024 through 04/22/2025    Fund:  prostate  Abie Cheek (Patty) Chet Burnet, CPhT  [facility], Zelda Salmon, [facility]/Oncology - Oral Chemotherapy Patient Advocate Specialist III Phone: [phone]  Fax: [phone]

## 2024-05-11 ENCOUNTER — Other Ambulatory Visit: Payer: Self-pay

## 2024-05-11 NOTE — Progress Notes (Signed)
 Clinical Intervention Note  Clinical Intervention Notes: Patient reported starting Gemtesa . Per [employer]rom[employer], Gemtesa  may increase exposure to Orgovyx . Patient is being monitored by both oncology and urology and as of office visit on 12/17, not having any issues with either medication.   Clinical Intervention Outcomes: Prevention of an adverse drug event   Advertising Account Planner

## 2024-05-13 ENCOUNTER — Other Ambulatory Visit: Payer: Self-pay

## 2024-05-14 ENCOUNTER — Inpatient Hospital Stay: Attending: Oncology

## 2024-05-14 ENCOUNTER — Inpatient Hospital Stay: Admitting: Pharmacist

## 2024-05-14 VITALS — BP 151/85 | HR 74 | Temp 97.5°F | Wt 122.3 lb

## 2024-05-14 DIAGNOSIS — C61 Malignant neoplasm of prostate: Secondary | ICD-10-CM

## 2024-05-14 LAB — CBC WITH DIFFERENTIAL (CANCER CENTER ONLY)
Abs Immature Granulocytes: 0.01 K/uL (ref 0.00–0.07)
Basophils Absolute: 0.1 K/uL (ref 0.0–0.1)
Basophils Relative: 1 %
Eosinophils Absolute: 0.2 K/uL (ref 0.0–0.5)
Eosinophils Relative: 4 %
HCT: 35 % — ABNORMAL LOW (ref 39.0–52.0)
Hemoglobin: 12.7 g/dL — ABNORMAL LOW (ref 13.0–17.0)
Immature Granulocytes: 0 %
Lymphocytes Relative: 40 %
Lymphs Abs: 1.5 K/uL (ref 0.7–4.0)
MCH: 32.2 pg (ref 26.0–34.0)
MCHC: 36.3 g/dL — ABNORMAL HIGH (ref 30.0–36.0)
MCV: 88.6 fL (ref 80.0–100.0)
Monocytes Absolute: 0.4 K/uL (ref 0.1–1.0)
Monocytes Relative: 11 %
Neutro Abs: 1.7 K/uL (ref 1.7–7.7)
Neutrophils Relative %: 44 %
Platelet Count: 253 K/uL (ref 150–400)
RBC: 3.95 MIL/uL — ABNORMAL LOW (ref 4.22–5.81)
RDW: 12.8 % (ref 11.5–15.5)
WBC Count: 3.8 K/uL — ABNORMAL LOW (ref 4.0–10.5)
nRBC: 0 % (ref 0.0–0.2)

## 2024-05-14 LAB — CMP (CANCER CENTER ONLY)
ALT: 13 U/L (ref 0–44)
AST: 19 U/L (ref 15–41)
Albumin: 4.3 g/dL (ref 3.5–5.0)
Alkaline Phosphatase: 75 U/L (ref 38–126)
Anion gap: 11 (ref 5–15)
BUN: 13 mg/dL (ref 8–23)
CO2: 27 mmol/L (ref 22–32)
Calcium: 9.8 mg/dL (ref 8.9–10.3)
Chloride: 92 mmol/L — ABNORMAL LOW (ref 98–111)
Creatinine: 0.95 mg/dL (ref 0.61–1.24)
GFR, Estimated: >60 mL/min
Glucose, Bld: 103 mg/dL — ABNORMAL HIGH (ref 70–99)
Potassium: 3.8 mmol/L (ref 3.5–5.1)
Sodium: 130 mmol/L — ABNORMAL LOW (ref 135–145)
Total Bilirubin: 0.4 mg/dL (ref 0.0–1.2)
Total Protein: 6.6 g/dL (ref 6.5–8.1)

## 2024-05-14 LAB — PSA: Prostatic Specific Antigen: <0.02 ng/mL (ref 0.00–4.00)

## 2024-05-14 NOTE — Progress Notes (Signed)
 "  Clinical Pharmacist Practitioner [facility]  [phone] [phone]  Patient Care Team: Sadie Manna, MD as PCP - General (Internal Medicine) Jacobo Evalene PARAS, MD as Consulting Physician (Oncology)   Name of the patient: Mario Hernandez  [id number]  21-Dec-1950   Date of visit: 05/14/24  HPI: Patient is a 74 y.o. male with stage IV prostate cancer. Currently being treated with apalutamide  and relugolix  which was started in 08/2023.   Reason for Consult: Oral chemotherapy follow-up for apalutamide  and relugolix  therapy.   PAST MEDICAL HISTORY: Past Medical History:  Diagnosis Date   BP (high blood pressure) 05/10/2015   Current tobacco use 05/10/2015   Elevated PSA    Helicobacter pylori gastritis    Hypertension    Malignant neoplastic disease (HCC) 05/10/2015   Prostate cancer [facility])    Urinary urgency     HEMATOLOGY/ONCOLOGY HISTORY:  Oncology History  Prostate cancer (HCC)  05/24/2015 Initial Diagnosis   Prostate cancer (HCC)   11/20/2023 Cancer Staging   Staging form: Prostate, AJCC 7th Edition - Clinical: Stage IV (TX, N1, M1a, PSA: 20 or greater, Gleason 7) - Signed by Jacobo Evalene PARAS, MD on 11/20/2023 Stage prefix: Initial diagnosis     ALLERGIES:  is allergic to penicillins.  MEDICATIONS:  Current Outpatient Medications  Medication Sig Dispense Refill   apalutamide  (ERLEADA ) 240 MG tablet Take 1 tablet (240 mg total) by mouth daily. 30 tablet 3   gabapentin (NEURONTIN) 100 MG capsule Take 100 mg by mouth.     losartan-hydrochlorothiazide (HYZAAR) 50-12.5 MG tablet Take 1 tablet by mouth daily.     meloxicam (MOBIC) 7.5 MG tablet Take 7.5 mg by mouth daily.     Multiple Vitamin (MULTIVITAMIN) tablet Take 1 tablet by mouth daily.     relugolix  (ORGOVYX ) 120 MG tablet Take 1 tablet (120 mg total) by mouth daily. 30 tablet 3   tadalafil  (CIALIS ) 5 MG tablet Take by mouth.     tamsulosin  (FLOMAX ) 0.4 MG CAPS  capsule Take 1 capsule (0.4 mg total) by mouth daily after supper. 30 capsule 12   Vibegron  (GEMTESA ) 75 MG TABS Take 1 tablet (75 mg total) by mouth daily. 30 tablet 11   No current facility-administered medications for this visit.    VITAL SIGNS: There were no vitals taken for this visit. There were no vitals filed for this visit.    Estimated body mass index is 18.55 kg/m as calculated from the following:   Height as of 04/08/24: 5' 8 (1.727 m).   Weight as of 04/08/24: 55.3 kg (122 lb).  LABS: CBC:    Component Value Date/Time   WBC 4.5 03/13/2024 1108   HGB 12.9 (L) 03/13/2024 1108   HGB 12.9 (L) 01/14/2024 1040   HGB 13.7 09/20/2011 1146   HCT 36.0 (L) 03/13/2024 1108   PLT 241 03/13/2024 1108   PLT 334 01/14/2024 1040   MCV 89.6 03/13/2024 1108   NEUTROABS 2.8 03/13/2024 1108   LYMPHSABS 1.1 03/13/2024 1108   MONOABS 0.4 03/13/2024 1108   EOSABS 0.1 03/13/2024 1108   BASOSABS 0.0 03/13/2024 1108   Comprehensive Metabolic Panel:    Component Value Date/Time   NA 128 (L) 03/13/2024 1107   K 3.7 03/13/2024 1107   K 4.0 09/20/2011 1146   CL 94 (L) 03/13/2024 1107   CO2 26 03/13/2024 1107   BUN 15 03/13/2024 1107   CREATININE 0.90 03/13/2024 1107   GLUCOSE 99 03/13/2024 1107   CALCIUM 9.3 03/13/2024  1107   AST 19 03/13/2024 1107   ALT 13 03/13/2024 1107   ALKPHOS 66 03/13/2024 1107   BILITOT 0.7 03/13/2024 1107   PROT 6.7 03/13/2024 1107   ALBUMIN 4.1 03/13/2024 1107     Present during today's visit: patient only  Assessment and Plan: CBC/CMP/PSA reviewed, continue apalutamide  240 mg daily and relugolix  120mg  daily PSA was undetectable again at last office visit, today still pending Overall patient is tolerating treatment, continues to report an increase in weakness. This appears to be associated with activities that involve a lot of lifting like lifting items to the top of his refrigerator or carrying in 5 bags of groceries.  Does not appear to be  affecting his ability to complete normal daily activities  Oral Chemotherapy Adherence: No missed doses reported No patient barriers to medication adherence identified.   New medications: None reported  Medication Access Issues: No issues patient fills at [facility]  Patient expressed understanding and was in agreement with this plan. He also understands that He can call clinic at any time with any questions, concerns, or complaints.   Follow-up plan: RTC as scheduled in 2 months  Thank you for allowing me to participate in the care of this very pleasant patient.   Time Total: 15 mins  Visit consisted of counseling and education on dealing with issues of symptom management in the setting of serious and potentially life-threatening illness.Greater than 50%  of this time was spent counseling and coordinating care related to the above assessment and plan.  Signed by: Lillyanne Bradburn N. Vannie Hilgert, PharmD, NEILA, CPP Hematology/Oncology Clinical Pharmacist Practitioner [facility] [phone]  05/14/2024 8:58 AM  "

## 2024-05-21 ENCOUNTER — Other Ambulatory Visit: Payer: Self-pay | Admitting: Pharmacist

## 2024-05-21 DIAGNOSIS — C61 Malignant neoplasm of prostate: Secondary | ICD-10-CM

## 2024-07-13 ENCOUNTER — Inpatient Hospital Stay

## 2024-07-13 ENCOUNTER — Inpatient Hospital Stay: Admitting: Oncology

## 2024-10-08 ENCOUNTER — Ambulatory Visit: Admitting: Urology
